# Patient Record
Sex: Female | Born: 1960 | Race: White | Hispanic: No | Smoking: Never smoker
Health system: Southern US, Community
[De-identification: ages and names within clinical notes are randomized; demographics above are authoritative.]

## PROBLEM LIST (undated history)

## (undated) DIAGNOSIS — Z974 Presence of external hearing-aid: Secondary | ICD-10-CM

## (undated) DIAGNOSIS — Z973 Presence of spectacles and contact lenses: Secondary | ICD-10-CM

## (undated) DIAGNOSIS — H9319 Tinnitus, unspecified ear: Secondary | ICD-10-CM

## (undated) DIAGNOSIS — D863 Sarcoidosis of skin: Secondary | ICD-10-CM

## (undated) DIAGNOSIS — I10 Essential (primary) hypertension: Secondary | ICD-10-CM

## (undated) DIAGNOSIS — F1020 Alcohol dependence, uncomplicated: Secondary | ICD-10-CM

## (undated) DIAGNOSIS — K603 Anal fistula, unspecified: Secondary | ICD-10-CM

## (undated) DIAGNOSIS — T4145XA Adverse effect of unspecified anesthetic, initial encounter: Secondary | ICD-10-CM

## (undated) DIAGNOSIS — D860 Sarcoidosis of lung: Secondary | ICD-10-CM

## (undated) DIAGNOSIS — T8859XA Other complications of anesthesia, initial encounter: Secondary | ICD-10-CM

## (undated) DIAGNOSIS — H903 Sensorineural hearing loss, bilateral: Secondary | ICD-10-CM

---

## 1997-09-08 ENCOUNTER — Inpatient Hospital Stay (HOSPITAL_COMMUNITY): Admission: EM | Admit: 1997-09-08 | Discharge: 1997-09-10 | Payer: Self-pay | Admitting: Emergency Medicine

## 1998-06-12 ENCOUNTER — Emergency Department (HOSPITAL_COMMUNITY): Admission: EM | Admit: 1998-06-12 | Discharge: 1998-06-12 | Payer: Self-pay | Admitting: Emergency Medicine

## 2005-11-28 ENCOUNTER — Encounter: Payer: Self-pay | Admitting: Internal Medicine

## 2006-12-18 ENCOUNTER — Encounter: Payer: Self-pay | Admitting: Internal Medicine

## 2006-12-23 ENCOUNTER — Ambulatory Visit: Payer: Self-pay | Admitting: Internal Medicine

## 2007-01-02 DIAGNOSIS — D869 Sarcoidosis, unspecified: Secondary | ICD-10-CM

## 2007-01-02 DIAGNOSIS — I10 Essential (primary) hypertension: Secondary | ICD-10-CM | POA: Insufficient documentation

## 2007-02-13 HISTORY — PX: ROBOTIC ASSISTED TOTAL HYSTERECTOMY: SHX6085

## 2007-03-12 ENCOUNTER — Ambulatory Visit: Payer: Self-pay | Admitting: Internal Medicine

## 2008-07-30 ENCOUNTER — Emergency Department (HOSPITAL_COMMUNITY): Admission: EM | Admit: 2008-07-30 | Discharge: 2008-07-31 | Payer: Self-pay | Admitting: Emergency Medicine

## 2008-07-31 ENCOUNTER — Ambulatory Visit: Payer: Self-pay | Admitting: *Deleted

## 2008-07-31 ENCOUNTER — Inpatient Hospital Stay (HOSPITAL_COMMUNITY): Admission: AD | Admit: 2008-07-31 | Discharge: 2008-08-03 | Payer: Self-pay | Admitting: Psychiatry

## 2008-08-10 ENCOUNTER — Ambulatory Visit (HOSPITAL_COMMUNITY): Payer: Self-pay | Admitting: Licensed Clinical Social Worker

## 2008-09-07 ENCOUNTER — Ambulatory Visit (HOSPITAL_COMMUNITY): Payer: Self-pay | Admitting: Marriage and Family Therapist

## 2008-09-09 ENCOUNTER — Ambulatory Visit (HOSPITAL_COMMUNITY): Payer: Self-pay | Admitting: Licensed Clinical Social Worker

## 2008-10-31 ENCOUNTER — Emergency Department (HOSPITAL_COMMUNITY): Admission: EM | Admit: 2008-10-31 | Discharge: 2008-11-01 | Payer: Self-pay | Admitting: Emergency Medicine

## 2008-11-01 ENCOUNTER — Inpatient Hospital Stay (HOSPITAL_COMMUNITY): Admission: AD | Admit: 2008-11-01 | Discharge: 2008-11-05 | Payer: Self-pay | Admitting: *Deleted

## 2008-11-01 ENCOUNTER — Ambulatory Visit: Payer: Self-pay | Admitting: *Deleted

## 2008-11-08 ENCOUNTER — Other Ambulatory Visit (HOSPITAL_COMMUNITY): Admission: RE | Admit: 2008-11-08 | Discharge: 2009-01-13 | Payer: Self-pay | Admitting: Psychiatry

## 2008-11-11 ENCOUNTER — Ambulatory Visit: Payer: Self-pay | Admitting: Psychiatry

## 2010-05-17 LAB — URINE DRUGS OF ABUSE SCREEN W ALC, ROUTINE (REF LAB)
Amphetamine Screen, Ur: NEGATIVE
Benzodiazepines.: NEGATIVE
Benzodiazepines.: NEGATIVE
Cocaine Metabolites: NEGATIVE
Cocaine Metabolites: NEGATIVE
Methadone: NEGATIVE
Methadone: NEGATIVE
Phencyclidine (PCP): NEGATIVE
Phencyclidine (PCP): NEGATIVE
Propoxyphene: NEGATIVE
Propoxyphene: NEGATIVE

## 2010-05-18 LAB — URINE DRUGS OF ABUSE SCREEN W ALC, ROUTINE (REF LAB)
Amphetamine Screen, Ur: NEGATIVE
Amphetamine Screen, Ur: NEGATIVE
Barbiturate Quant, Ur: NEGATIVE
Benzodiazepines.: NEGATIVE
Benzodiazepines.: POSITIVE — AB
Cocaine Metabolites: NEGATIVE
Cocaine Metabolites: NEGATIVE
Creatinine,U: 59.9 mg/dL
Creatinine,U: 74.7 mg/dL
Marijuana Metabolite: NEGATIVE
Methadone: NEGATIVE
Methadone: NEGATIVE
Methadone: NEGATIVE
Opiate Screen, Urine: NEGATIVE
Phencyclidine (PCP): NEGATIVE
Propoxyphene: NEGATIVE

## 2010-05-18 LAB — BENZODIAZEPINE, QUANTITATIVE, URINE
Alprazolam (GC/LC/MS), ur confirm: NEGATIVE
Flurazepam GC/MS Conf: NEGATIVE
Flurazepam GC/MS Conf: NEGATIVE
Nordiazepam GC/MS Conf: NEGATIVE

## 2010-05-19 LAB — CBC
HCT: 41.1 % (ref 36.0–46.0)
MCHC: 35.2 g/dL (ref 30.0–36.0)
MCV: 97 fL (ref 78.0–100.0)
Platelets: 283 10*3/uL (ref 150–400)
WBC: 6.2 10*3/uL (ref 4.0–10.5)

## 2010-05-19 LAB — URINE DRUGS OF ABUSE SCREEN W ALC, ROUTINE (REF LAB)
Cocaine Metabolites: NEGATIVE
Ethyl Alcohol: <10 mg/dL (ref <10)
Methadone: NEGATIVE
Opiate Screen, Urine: NEGATIVE
Phencyclidine (PCP): NEGATIVE
Propoxyphene: NEGATIVE

## 2010-05-19 LAB — URINALYSIS, ROUTINE W REFLEX MICROSCOPIC
Glucose, UA: NEGATIVE mg/dL
Hgb urine dipstick: NEGATIVE
Specific Gravity, Urine: 1.006 (ref 1.005–1.030)
pH: 5.5 (ref 5.0–8.0)

## 2010-05-19 LAB — RAPID URINE DRUG SCREEN, HOSP PERFORMED
Amphetamines: NOT DETECTED
Barbiturates: NOT DETECTED
Benzodiazepines: NOT DETECTED
Opiates: NOT DETECTED

## 2010-05-19 LAB — LIPASE, BLOOD: Lipase: 41 U/L (ref 11–59)

## 2010-05-19 LAB — COMPREHENSIVE METABOLIC PANEL
AST: 59 U/L — ABNORMAL HIGH (ref 0–37)
Albumin: 3.6 g/dL (ref 3.5–5.2)
BUN: 8 mg/dL (ref 6–23)
CO2: 24 mEq/L (ref 19–32)
Calcium: 8.6 mg/dL (ref 8.4–10.5)
Chloride: 103 mEq/L (ref 96–112)
Creatinine, Ser: 0.66 mg/dL (ref 0.4–1.2)
GFR calc Af Amer: >60 mL/min (ref >60)
GFR calc non Af Amer: >60 mL/min (ref >60)
Total Bilirubin: 0.6 mg/dL (ref 0.3–1.2)

## 2010-05-19 LAB — DIFFERENTIAL
Basophils Absolute: 0 10*3/uL (ref 0.0–0.1)
Lymphocytes Relative: 39 % (ref 12–46)
Lymphs Abs: 2.4 10*3/uL (ref 0.7–4.0)
Neutro Abs: 3.1 10*3/uL (ref 1.7–7.7)

## 2010-05-19 LAB — BENZODIAZEPINE, QUANTITATIVE, URINE
Flurazepam GC/MS Conf: NEGATIVE
Nordiazepam GC/MS Conf: 140 ng/mL
Oxazepam GC/MS Conf: 1900 ng/mL

## 2010-05-22 LAB — POCT I-STAT, CHEM 8
BUN: 3 mg/dL — ABNORMAL LOW (ref 6–23)
Chloride: 99 mEq/L (ref 96–112)
Glucose, Bld: 106 mg/dL — ABNORMAL HIGH (ref 70–99)
HCT: 46 % (ref 36.0–46.0)
Potassium: 3.4 mEq/L — ABNORMAL LOW (ref 3.5–5.1)

## 2010-05-22 LAB — RAPID URINE DRUG SCREEN, HOSP PERFORMED
Amphetamines: NOT DETECTED
Barbiturates: NOT DETECTED
Tetrahydrocannabinol: NOT DETECTED

## 2010-05-22 LAB — ETHANOL: Alcohol, Ethyl (B): 182 mg/dL — ABNORMAL HIGH (ref 0–10)

## 2010-05-22 LAB — CBC
MCHC: 35.3 g/dL (ref 30.0–36.0)
MCV: 97 fL (ref 78.0–100.0)
Platelets: 213 10*3/uL (ref 150–400)
RDW: 16.3 % — ABNORMAL HIGH (ref 11.5–15.5)

## 2010-05-22 LAB — URINALYSIS, ROUTINE W REFLEX MICROSCOPIC
Hgb urine dipstick: NEGATIVE
Nitrite: NEGATIVE
Protein, ur: NEGATIVE mg/dL
Urobilinogen, UA: 0.2 mg/dL (ref 0.0–1.0)

## 2010-05-22 LAB — DIFFERENTIAL
Basophils Absolute: 0 10*3/uL (ref 0.0–0.1)
Basophils Relative: 1 % (ref 0–1)
Eosinophils Absolute: 0.1 10*3/uL (ref 0.0–0.7)
Monocytes Relative: 6 % (ref 3–12)
Neutrophils Relative %: 53 % (ref 43–77)

## 2010-06-27 NOTE — Discharge Summary (Signed)
Miranda Dominguez, Miranda Dominguez NO.:  [id number]   MEDICAL RECORD NO.:  [id number]          PATIENT TYPE:  IPS   LOCATION:  0304                          FACILITY:  BH   PHYSICIAN:  Jasmine Pang, M.D. DATE OF BIRTH:  07-25-1960   DATE OF ADMISSION:  07/31/2008  DATE OF DISCHARGE:  08/03/2008                               DISCHARGE SUMMARY   IDENTIFICATION:  This is a 50 year old divorced white female from  [place], who was admitted on a voluntary basis on July 31, 2008.   HISTORY OF PRESENT ILLNESS:  The patient came to [facility]  Emergency Department and presented requesting detox from alcohol.  She  reported that prior to May 2009 she had been sober for about 3 years.  She made a return visit to her home in [place] and relapsed drinking  beer.  She had also lost her employment February 2009.  She has recently  been reemployed.  She is 60 days into 90-day probationary period, but  she drinks over a pack of beer a day.  She reports that she gets shakes,  but no DTs.  She reports her last drink was just before coming to the  emergency room.  Her initial alcohol level was 334.  It came down to 182  prior to being transferred over here for detox.  She denies withdrawal  seizures, blackouts for full blown DTs.  She has a roommate, who is a  big support to her.  They used to be partners, but now they are just  roommates.  The patient reports having had detox here at [facility]  about 10 years ago once or twice.  She has detoxed herself since then.  She has had no other actual rehab.  For further admission information,  see psychiatric admission assessment.  An initial axis I diagnosis of  alcohol dependence was given.  On axis III diagnosis of hypertension was  given.   PHYSICAL FINDINGS:  There were no acute physical or medical problems  noted.  She was fully evaluated at the [facility] ED.   DIAGNOSTIC STUDIES:  She had an alcohol level of 334, which  came down to  182 in the ED.  Her UDS was negative.  Her urinalysis was negative.  Her  sodium was slightly low at 133.  Her other laboratory results did not  require any followup.   HOSPITAL COURSE:  Upon admission, the patient was placed on the Librium  detox protocol.  She was also restarted on her home medication of  atenolol 50 mg p.o. daily, hydrochlorothiazide 25 mg daily, and Celexa  20 mg q.h.s.  In individual sessions, the patient was initially casually  dressed with fair eye contact.  Motor behavior was within normal limits.  Speech was normal rate and flow.  Mood was anxious, depressed.  Affect  was consistent with mood.  There was no evidence of thought disorder or  psychosis.  The patient's Celexa was increased to 20 mg p.o. q.a.m. and  q.h.s.  As hospitalization progressed, patient's sleep was good appetite  was good.  She  became less depressed, less anxious.  She discussed her  support system, especially her roommate, who has been a good friend to  her.  Roommate plans to pick her up at discharge tomorrow.  She was  having no side effects on her medications.  On August 03, 2008, mental  status had improved markedly from admission status.  The patient's mood  continued to be less depressed, less anxious.  Affect consistent with  mood.  There was no suicidal or homicidal ideation.  No thoughts of self-  injurious behavior.  No auditory or visual hallucinations.  No thought  disorder.  No paranoia or delusions.  The patient detoxed well without  complications.  She wanted to go home today and was felt to be safe for  discharge. She did have a family session with her roommate this morning,  who was very supportive.  Unfortunately, she did find out that her dog  had died while she was here, and she was somewhat tearful when  discussing this particular issue.  She felt well enough; however, to  leave the hospital and wanted to go home to make plans about the dog.   DISCHARGE  DIAGNOSES:  Axis I:  Depressive disorder, not otherwise  specified, alcohol dependence.  Axis II:  None.  Axis III:  Hypertension.  Axis IV:  Moderate (other psychosocial issues, burden of psychiatric  illness, and chemical dependency, new job).  Axis V:  Global assessment of functioning was 60 upon discharge.  GAF  was 50 upon admission.  GAF highest past year was 70-75.   DISCHARGE PLAN:  There was no specific activity level or dietary  restrictions.   POSTHOSPITAL CARE PLANS:  The patient will see Cleophas Dunker for  counseling on August 10, 2008, at 9:30 a.m.  She will also follow up with  Dr. Andee Poles for medication management.   DISCHARGE MEDICATIONS:  1. Tenormin 50 mg daily.  2. Hydrochlorothiazide 25 mg daily.  3. Celexa 20 mg twice daily.  4. Librium 25 mg 1 pill daily for 3 days then discontinue and detox      will be complete.      Jasmine Pang, M.D.  Electronically Signed     BHS/MEDQ  D:  08/03/2008  T:  08/04/2008  Job:  [id number]

## 2010-06-27 NOTE — H&P (Signed)
Miranda Dominguez, HERMAN NO.:  [id number]   MEDICAL RECORD NO.:  [id number]          PATIENT TYPE:  IPS   LOCATION:  0304                          FACILITY:  BH   PHYSICIAN:  Jasmine Pang, M.D. DATE OF BIRTH:  1960-06-27   DATE OF ADMISSION:  07/31/2008  DATE OF DISCHARGE:                       PSYCHIATRIC ADMISSION ASSESSMENT   HISTORY OF PRESENT ILLNESS:  This is a voluntary admission to the  services of Dr. Milford Cage.  This is a 50 year old divorced white  female.  She came to the [facility] emergency department,  presented requesting detox from alcohol.  She reported that prior to May  of 2009 she had been sober about 3 years.  She made a return visit to  her home in [place] and relapsed drinking beer.  She had also lost her  employment in February of 2009.  She has recently been re-employed.  She  is 60 days into a 90 probationary period but she drinks over a pack of  beer a day.  She reports that she gets shakes but no DT's.  She reports  her last drink was just before coming to the emergency room.  Her  initial alcohol level was 334.  It came down to 182 prior to being  transferred over here for detox.  She denies withdrawal seizures, black  outs or full blown DT's.  She has a roommate, a Tawni Carnes who is very  active in Al-Anon.  They used to be partners but now they are just  roommates and Ms. Reed was active in Al-Anon prior to these two meeting.   PAST PSYCHIATRIC HISTORY:  She reports having had detox here at [facility] about 10 years ago once or twice and she has detoxed herself since  then.  No other actual rehab's.   SOCIAL HISTORY:  She has had some college.  She has been married and  divorced once.  She has no children.  She is 60 days into her current  employment with [employer].   FAMILY HISTORY:  Her mother and father drank after dinner, after work as  was the custom in [place] but neither one of them became  alcoholics.  She has a maternal uncle who was sober for 50 years.   PRIMARY CARE PHYSICIAN:  [facility].  She is followed for hypertension.  Today she has mild tremor.   MEDICATIONS:  She is currently prescribed:  1. Atenolol 50 mg p.o. daily.  2. Hydrochlorothiazide 25 mg p.o. daily.  3. Celexa 25 mg at h.s.   DRUG ALLERGIES:  No known drug allergies.   POSITIVE PHYSICAL FINDINGS:  As already stated, she had an enthusiastic  alcohol level of 334 which came down to 182.  Her UDS was negative.  Her  urinalysis was negative.  Her sodium was slightly low at 133.  Her other  laboratory results did not require any followup.   MENTAL STATUS EXAM:  Today, she is alert and oriented.  She is  appropriately, albeit casually, groomed in shorts and tee shirt.  She  appears to be adequately nourished.  Her  speech is a little soft.  She  reports that when she came back from [place] about a year ago she had  issues with hearing with an ear infection and she has not quite fully  recovered her hearing since.  Her mood is appropriately anxiously  depressed.  She is quite concerned about her employment.  Her affect is  congruent.  Judgment and insight are good.  Concentration and memory are  intact.  Intelligence is average.  She denies any suicidal or homicidal  ideations. She denies any auditory or visual hallucinations.   DIAGNOSES:  Axis I:  Alcohol dependence requiring detoxification.  Axis II:  Deferred.  Axis III:  Hypertension.  Axis IV:  Social.  Axis V:  50.   PLAN:  The plan is to help her through with withdrawal by utilizing the  low dose Librium protocol.  She requests assistance from the case  manager in contacting her job in the morning to make appropriate  discharge plans.   ESTIMATED LENGTH OF STAY:  Is 3 days.      Mickie Leonarda Salon, P.A.-C.      Jasmine Pang, M.D.  Electronically Signed    MD/MEDQ  D:  08/01/2008  T:  08/01/2008  Job:  [id number]

## 2010-06-27 NOTE — Assessment & Plan Note (Signed)
HEALTHCARE                             PULMONARY OFFICE NOTE   Miranda, Dominguez                     MRN:          [id number]  DATE:12/23/2006                            DOB:          11-01-1960    REASON FOR CONSULTATION:  Sarcoid.   HISTORY:  A 50 year old white female never-smoker with no significant  respiratory complaints.  She developed a lesion over her left proximal  arm that was biopsied a year ago by Dr. 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  This led to a CT  scan suggesting sarcoid and this led to the patient seeing me.  She  specifically denied any acute or chronic respiratory symptoms consisting  of cough, dyspnea, unexplained chest pains, fevers, chills, sweats,  myalgias, arthralgias or ocular complaints.   PAST MEDICAL HISTORY:  Significant for hypertension only.   ALLERGIES:  None known.   MEDICATIONS:  Atenolol and hydrochlorothiazide.   SOCIAL HISTORY:  She has never smoked and denies any usual travel, pet  or hobby, or work exposure history.   FAMILY HISTORY:  Significant for the absence of sarcoid.  Positive for  cancer in her mother of unknown cell type, prostate cancer in her  father.   REVIEW OF SYSTEMS:  Also taken in detail on the worksheet, negative  except as outlined above.   PHYSICAL EXAMINATION:  This is a pleasant ambulatory white female in no  acute distress.  She is afebrile with normal vital signs.  HEENT:  Unremarkable, oropharynx is clear.  NECK:  Supple without cervical adenopathy or tenderness.  Trachea is  midline, no thyromegaly.  LUNG FIELDS:  Perfectly clear bilaterally to auscultation and percussion  with no adventitial breath sounds.  Regular rate and rhythm without murmur, gallop, rub.  ABDOMEN:  Soft, benign.  EXTREMITIES:  Warm without calf tenderness, cyanosis, clubbing, or  edema.  There is a slightly erythematous plaque-like lesion about the  size of a dime over her left arm proximally in the lateral but noting on  the extensor surfaces of the elbows or knees or face.   Chest CT scan was available from [facility] showing parenchymal  scarring in the right upper lobe and diffuse increase in interstitial  markings with a fibronodular pattern associated with lymph nodes  containing calcium.   LABORATORY DATA:  Included a normal CBC with no eosinophils and a sed  rate of 24 on October 13.  ACE level was not reported.   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  She does not recall any symptoms that suggested  pulmonary symptoms related to sarcoid and denies ever having had a  previous chest x-ray, at least not to her knowledge for the last 10  years.  The issue is therefore whether there is any need for a specific  diagnosis of pulmonary sarcoid here and whether or not she might be a  candidate for prednisone.  Certainly, from a pulmonary perspective I  think it would be difficult to  justify the side effects of prednisone  for limited benefit.   She has already been prescribed a steroid cream by Dr. Swaziland for the  very focal involvement of her skin and I think this is certainly  appropriate and would consider adding Plaquenil if her skin disease  became much more of an issues, which presently is not the case.   For now, I plan to follow her up in 6 weeks with a baseline PFT and  chest x-ray (she said would think about whether she might have some old  x-rays somewhere that she could dig up although I think it is unlikely  that we will find any, but I have asked her to bring these if this is  the case.   ADDENDUM:  I was able to find a chest x-ray report dated November 25, 2006, that shows classic sarcoid changes by report but this was not  available for review today.  No previous x-rays were commented on  at the  time of this report.     Charlaine Dalton. Sherene Sires, MD, [facility]  Electronically Signed    MBW/MedQ  DD: 12/23/2006  DT: 12/24/2006  Job #: [id number]   cc:   Carma Leaven, DO  Amy Y. Swaziland, M.D.

## 2011-11-21 DIAGNOSIS — K611 Rectal abscess: Secondary | ICD-10-CM | POA: Diagnosis present

## 2012-08-07 HISTORY — PX: PLACEMENT OF SETON: SHX6029

## 2015-02-13 HISTORY — PX: INCISION AND DRAINAGE ABSCESS ANAL: SUR669

## 2015-02-23 ENCOUNTER — Telehealth: Payer: Self-pay | Admitting: General Surgery

## 2015-02-23 NOTE — Telephone Encounter (Signed)
Patient called stating she was having a lot of bleeding in her gallstones was about the common out. I attempted to call the patient back and no one answered.

## 2015-06-02 ENCOUNTER — Emergency Department (HOSPITAL_COMMUNITY)
Admission: EM | Admit: 2015-06-02 | Discharge: 2015-06-02 | Disposition: A | Discharge status: Home / Self Care | Payer: Managed Care, Other (non HMO) | Attending: Emergency Medicine | Admitting: Emergency Medicine

## 2015-06-02 ENCOUNTER — Encounter (HOSPITAL_COMMUNITY): Payer: Self-pay | Admitting: *Deleted

## 2015-06-02 DIAGNOSIS — F1029 Alcohol dependence with unspecified alcohol-induced disorder: Secondary | ICD-10-CM | POA: Diagnosis not present

## 2015-06-02 DIAGNOSIS — I10 Essential (primary) hypertension: Secondary | ICD-10-CM | POA: Diagnosis not present

## 2015-06-02 DIAGNOSIS — F1010 Alcohol abuse, uncomplicated: Secondary | ICD-10-CM | POA: Diagnosis present

## 2015-06-02 HISTORY — DX: Essential (primary) hypertension: I10

## 2015-06-02 NOTE — ED Notes (Addendum)
Pt. Family member approached RN at desk asking when MD would be in the room. RN explained MD very busy with critical patients and will be in the room whenever they are available. Pt. Family member sts that she is very upset that they have been waiting so long. RN explained pt. Will be evaluated by TTS soon.

## 2015-06-02 NOTE — ED Notes (Signed)
Received call from Cyndie MullAnna Andrews, [facility], regarding [facility]. Questioned as to whether patient is SI/HI or has other mental health issues. RN stepped in to ask patient. Patient denies SI/HI/mental health issues, and reports that she is strictly here for Detox. Reports that she was instructed by EAP to come to the ED for medical clearance in order to proceed with detox.  Tobi Bastosnna states that she will fax information to Pod A for patient. If EDP requests further assessment, [facility] will need to be notified. At [facility] point, [facility] not feel [facility] is needed since patient does not have mental health issues.

## 2015-06-02 NOTE — ED Provider Notes (Signed)
CSN: [id number]     Arrival date & time 06/02/15  1429 History   First MD Initiated Contact with Patient 06/02/15 1819     Chief Complaint  Patient presents with  . Alcohol Problem  . Medical Clearance     (Consider location/radiation/quality/duration/timing/severity/associated sxs/prior Treatment) HPI Comments: Patient is a 55 year old female with history of chronic alcoholism. She presents today requesting detox from alcohol. She reports drinking at least 12 years daily and has done this for many years. She reports that she has a "maintenance drinker" and requires alcohol to prevent her from becoming shaky and going into DTs. She has 1 prior admission for detox. This was 7 or 8 years ago. She is here with her significant other who reports her drinking is becoming a problem.  Patient is a 55 y.o. female presenting with alcohol problem. The history is provided by the patient.  Alcohol Problem This is a chronic problem. The problem occurs constantly. Nothing aggravates the symptoms. Nothing relieves the symptoms. She has tried nothing for the symptoms.    Past Medical History  Diagnosis Date  . Hypertension    History reviewed. No pertinent past surgical history. History reviewed. No pertinent family history. Social History  Substance Use Topics  . Smoking status: Never Smoker   . Smokeless tobacco: None  . Alcohol Use: Yes     Comment: heavily   OB History    No data available     Review of Systems  All other systems reviewed and are negative.     Allergies  Review of patient's allergies indicates no known allergies.  Home Medications   Prior to Admission medications   Medication Sig Start Date End Date Taking? Authorizing Provider  atenolol (TENORMIN) 50 MG tablet Take by mouth.   Yes Historical Provider, MD  hydrochlorothiazide (HYDRODIURIL) 25 MG tablet Take by mouth.   Yes Historical Provider, MD  LORazepam (ATIVAN) 0.5 MG tablet Take 0.5 tablets by mouth as  needed. anxiety 05/12/15  Yes Historical Provider, MD   BP 131/91 mmHg  Pulse 92  Temp(Src) 98.2 F (36.8 C) (Oral)  Resp 14  Ht 5\' 5"  (1.651 m)  Wt 180 lb (81.647 kg)  BMI 29.95 kg/m2  SpO2 98% Physical Exam  Constitutional: She is oriented to person, place, and time. She appears well-developed and well-nourished. No distress.  HENT:  Head: Normocephalic and atraumatic.  Eyes: EOM are normal. Pupils are equal, round, and reactive to light.  Neck: Normal range of motion. Neck supple.  Cardiovascular: Normal rate and regular rhythm.  Exam reveals no gallop and no friction rub.   No murmur heard. Pulmonary/Chest: Effort normal and breath sounds normal. No respiratory distress. She has no wheezes.  Abdominal: Soft. Bowel sounds are normal. She exhibits no distension. There is no tenderness.  Musculoskeletal: Normal range of motion.  Neurological: She is alert and oriented to person, place, and time.  Skin: Skin is warm and dry. She is not diaphoretic.  Nursing note and vitals reviewed.   ED Course  Procedures (including critical care time) Labs Review Labs Reviewed - No data to display  Imaging Review No results found. I have personally reviewed and evaluated these images and lab results as part of my medical decision-making.    MDM   Final diagnoses:  None    Patient is a 55 year old female who presents with complaints of alcohol abuse and is requesting detox from this. She tells me that [facility] and was instructed to come to the emergency department for admission for this. The patient waited in the waiting room for several hours prior to being brought back to her room. I then probably evaluated her and placed consultation for TTS to speak with her. Apparently TTS was having a heavy work load and asked the nurse whether the patient was suicidal or homicidal or exhibiting any evidence for psychiatric dual diagnosis. When informed the  patient was not, information was faxed by TTS for the patient to arrange outpatient treatment.  I became busy with several critical patients. When I provided this information to the patient and her significant other, they were very unhappy with how long they were here and to not be transferred to behavioral health. I attempted to explain the situation to them, however they continue to express displeasure with how this was handled. She appears in my opinion to be medically cleared and I do not feel as though any workup is indicated. She is to call the numbers but she was provided to arrange outpatient treatment.    Geoffery Lyons, MD 06/02/15 2044

## 2015-06-02 NOTE — ED Notes (Signed)
Pt reports last drinking alcohol pta, is requesting detox. Denies SI or HI.

## 2015-06-02 NOTE — [facility] Counselor (Addendum)
This TTS Counselor spoke with Kendal HymenBonnie, [facility], as EDP was busy with critical cases in the ED at the moment. This Clinical research associatewriter asked about necessity for TTS assessment, as pt had originally stated that she was here strictly for detox from alcohol. This Clinical research associatewriter explained that [facility] did not treat straight detox cases any longer and inquired about any additional mental health issues, including SI/HI and A/VH.  Kendal HymenBonnie, RN, went to speak with pt and she denied any hx of mental illness or current mood d/o. She denies SI/HI and states she is here strictly for alcohol detox. She says her EAP told her to come to the ED tonight. This Clinical research associatewriter overheard this entire conversation between [employer] and pt while remaining on the line.   This Clinical research associatewriter agreed to fax SA/detox resources to ED for the pt. RN stated that she would relay this info to EDP and call back if he insisted that we see pt for an assessment anyway. Until then, writer asked that consult be removed and re-entered if needed.  This counselor faxed resource info to both Pod [facility] around 8:10pm.   Resources were re-sent at 8:30pm. Pt is cleared from a psych standpoint.   - Cyndie MullAnna Sufyan Meidinger, [facility]   Therapeutic Triage   [facility]     Ext [phone]

## 2015-06-02 NOTE — Discharge Instructions (Signed)
Call the provided phone numbers to arrange treatment.   Alcohol Use Disorder Alcohol use disorder is a mental disorder. It is not a one-time incident of heavy drinking. Alcohol use disorder is the excessive and uncontrollable use of alcohol over time that leads to problems with functioning in one or more areas of daily living. People with this disorder risk harming themselves and others when they drink to excess. Alcohol use disorder also can cause other mental disorders, such as mood and anxiety disorders, and serious physical problems. People with alcohol use disorder often misuse other drugs.  Alcohol use disorder is common and widespread. Some people with this disorder drink alcohol to cope with or escape from negative life events. Others drink to relieve chronic pain or symptoms of mental illness. People with a family history of alcohol use disorder are at higher risk of losing control and using alcohol to excess.  Drinking too much alcohol can cause injury, accidents, and health problems. One drink can be too much when you are:  Working.  Pregnant or breastfeeding.  Taking medicines. Ask your doctor.  Driving or planning to drive. SYMPTOMS  Signs and symptoms of alcohol use disorder may include the following:   Consumption ofalcohol inlarger amounts or over a longer period of time than intended.  Multiple unsuccessful attempts to cutdown or control alcohol use.   A great deal of time spent obtaining alcohol, using alcohol, or recovering from the effects of alcohol (hangover).  A strong desire or urge to use alcohol (cravings).   Continued use of alcohol despite problems at work, school, or home because of alcohol use.   Continued use of alcohol despite problems in relationships because of alcohol use.  Continued use of alcohol in situations when it is physically hazardous, such as driving a car.  Continued use of alcohol despite awareness of a physical or psychological  problem that is likely related to alcohol use. Physical problems related to alcohol use can involve the brain, heart, liver, stomach, and intestines. Psychological problems related to alcohol use include intoxication, depression, anxiety, psychosis, delirium, and dementia.   The need for increased amounts of alcohol to achieve the same desired effect, or a decreased effect from the consumption of the same amount of alcohol (tolerance).  Withdrawal symptoms upon reducing or stopping alcohol use, or alcohol use to reduce or avoid withdrawal symptoms. Withdrawal symptoms include:  Racing heart.  Hand tremor.  Difficulty sleeping.  Nausea.  Vomiting.  Hallucinations.  Restlessness.  Seizures. DIAGNOSIS Alcohol use disorder is diagnosed through an assessment by your health care provider. Your health care provider may start by asking three or four questions to screen for excessive or problematic alcohol use. To confirm a diagnosis of alcohol use disorder, at least two symptoms must be present within a 6066-month period. The severity of alcohol use disorder depends on the number of symptoms:  Mild--two or three.  Moderate--four or five.  Severe--six or more. Your health care provider may perform a physical exam or use results from lab tests to see if you have physical problems resulting from alcohol use. Your health care provider may refer you to a mental health professional for evaluation. TREATMENT  Some people with alcohol use disorder are able to reduce their alcohol use to low-risk levels. Some people with alcohol use disorder need to quit drinking alcohol. When necessary, mental health professionals with specialized training in substance use treatment can help. Your health care provider can help you decide how severe your alcohol  use disorder is and what type of treatment you need. The following forms of treatment are available:   Detoxification. Detoxification involves the use of  prescription medicines to prevent alcohol withdrawal symptoms in the first week after quitting. This is important for people with a history of symptoms of withdrawal and for heavy drinkers who are likely to have withdrawal symptoms. Alcohol withdrawal can be dangerous and, in severe cases, cause death. Detoxification is usually provided in a hospital or in-patient substance use treatment facility.  Counseling or talk therapy. Talk therapy is provided by substance use treatment counselors. It addresses the reasons people use alcohol and ways to keep them from drinking again. The goals of talk therapy are to help people with alcohol use disorder find healthy activities and ways to cope with life stress, to identify and avoid triggers for alcohol use, and to handle cravings, which can cause relapse.  Medicines.Different medicines can help treat alcohol use disorder through the following actions:  Decrease alcohol cravings.  Decrease the positive reward response felt from alcohol use.  Produce an uncomfortable physical reaction when alcohol is used (aversion therapy).  Support groups. Support groups are run by people who have quit drinking. They provide emotional support, advice, and guidance. These forms of treatment are often combined. Some people with alcohol use disorder benefit from intensive combination treatment provided by specialized substance use treatment centers. Both inpatient and outpatient treatment programs are available.   This information is not intended to replace advice given to you by your health care provider. Make sure you discuss any questions you have with your health care provider.   Document Released: 03/08/2004 Document Revised: 02/19/2014 Document Reviewed: 05/08/2012 Elsevier Interactive Patient Education [employer]

## 2015-07-08 DIAGNOSIS — H9319 Tinnitus, unspecified ear: Secondary | ICD-10-CM

## 2015-07-08 DIAGNOSIS — H903 Sensorineural hearing loss, bilateral: Secondary | ICD-10-CM

## 2015-07-08 HISTORY — DX: Tinnitus, unspecified ear: H93.19

## 2015-07-08 HISTORY — DX: Sensorineural hearing loss, bilateral: H90.3

## 2015-07-10 ENCOUNTER — Encounter (HOSPITAL_COMMUNITY): Payer: Self-pay | Admitting: Emergency Medicine

## 2015-07-10 ENCOUNTER — Emergency Department (HOSPITAL_COMMUNITY)
Admission: EM | Admit: 2015-07-10 | Discharge: 2015-07-10 | Disposition: A | Discharge status: Home / Self Care | Payer: Managed Care, Other (non HMO) | Attending: Emergency Medicine | Admitting: Emergency Medicine

## 2015-07-10 ENCOUNTER — Telehealth: Payer: Self-pay | Admitting: Surgery

## 2015-07-10 DIAGNOSIS — E669 Obesity, unspecified: Secondary | ICD-10-CM | POA: Insufficient documentation

## 2015-07-10 DIAGNOSIS — E876 Hypokalemia: Secondary | ICD-10-CM

## 2015-07-10 DIAGNOSIS — K603 Anal fistula: Secondary | ICD-10-CM

## 2015-07-10 DIAGNOSIS — K610 Anal abscess: Secondary | ICD-10-CM | POA: Diagnosis not present

## 2015-07-10 DIAGNOSIS — I10 Essential (primary) hypertension: Secondary | ICD-10-CM | POA: Diagnosis not present

## 2015-07-10 DIAGNOSIS — L309 Dermatitis, unspecified: Secondary | ICD-10-CM | POA: Insufficient documentation

## 2015-07-10 DIAGNOSIS — K611 Rectal abscess: Secondary | ICD-10-CM | POA: Diagnosis present

## 2015-07-10 HISTORY — DX: Sensorineural hearing loss, bilateral: H90.3

## 2015-07-10 HISTORY — DX: Tinnitus, unspecified ear: H93.19

## 2015-07-10 LAB — CBC WITH DIFFERENTIAL/PLATELET
BASOS ABS: 0 10*3/uL (ref 0.0–0.1)
Basophils Relative: 0 %
EOS PCT: 1 %
Eosinophils Absolute: 0.1 10*3/uL (ref 0.0–0.7)
HEMATOCRIT: 38.8 % (ref 36.0–46.0)
Hemoglobin: 14 g/dL (ref 12.0–15.0)
LYMPHS PCT: 13 %
Lymphs Abs: 1.6 10*3/uL (ref 0.7–4.0)
MCH: 33.1 pg (ref 26.0–34.0)
MCHC: 36.1 g/dL — ABNORMAL HIGH (ref 30.0–36.0)
MCV: 91.7 fL (ref 78.0–100.0)
Monocytes Absolute: 1.2 10*3/uL — ABNORMAL HIGH (ref 0.1–1.0)
Monocytes Relative: 10 %
NEUTROS ABS: 9.4 10*3/uL — AB (ref 1.7–7.7)
Neutrophils Relative %: 76 %
PLATELETS: 302 10*3/uL (ref 150–400)
RBC: 4.23 MIL/uL (ref 3.87–5.11)
RDW: 12.5 % (ref 11.5–15.5)
WBC: 12.4 10*3/uL — AB (ref 4.0–10.5)

## 2015-07-10 LAB — BASIC METABOLIC PANEL
ANION GAP: 11 (ref 5–15)
BUN: 8 mg/dL (ref 6–20)
CO2: 25 mmol/L (ref 22–32)
Calcium: 9.6 mg/dL (ref 8.9–10.3)
Chloride: 98 mmol/L — ABNORMAL LOW (ref 101–111)
Creatinine, Ser: 0.88 mg/dL (ref 0.44–1.00)
GFR calc Af Amer: >60 mL/min (ref >60)
Glucose, Bld: 95 mg/dL (ref 65–99)
POTASSIUM: 2.7 mmol/L — AB (ref 3.5–5.1)
SODIUM: 134 mmol/L — AB (ref 135–145)

## 2015-07-10 MED ORDER — MENTHOL 3 MG MT LOZG: 1.0000 | LOZENGE | OROMUCOSAL | Status: DC | PRN

## 2015-07-10 MED ORDER — ALUM & MAG HYDROXIDE-SIMETH 200-200-20 MG/5ML PO SUSP: 30.0000 mL | Freq: Four times a day (QID) | ORAL | Status: DC | PRN

## 2015-07-10 MED ORDER — LIDOCAINE HCL 1 % IJ SOLN
INTRAMUSCULAR | Status: AC
  Filled 2015-07-10: qty 20

## 2015-07-10 MED ORDER — NAPROXEN 500 MG PO TABS: 500.0000 mg | ORAL_TABLET | Freq: Three times a day (TID) | ORAL | Status: DC

## 2015-07-10 MED ORDER — POTASSIUM CHLORIDE CRYS ER 20 MEQ PO TBCR
60.0000 meq | EXTENDED_RELEASE_TABLET | Freq: Once | ORAL | Status: AC
  Administered 2015-07-10: 60 meq via ORAL
  Filled 2015-07-10: qty 3

## 2015-07-10 MED ORDER — MAGIC MOUTHWASH
15.0000 mL | Freq: Four times a day (QID) | ORAL | Status: DC | PRN
  Filled 2015-07-10: qty 15

## 2015-07-10 MED ORDER — LACTATED RINGERS IV BOLUS (SEPSIS): 1000.0000 mL | Freq: Three times a day (TID) | INTRAVENOUS | Status: DC | PRN

## 2015-07-10 MED ORDER — LORAZEPAM 0.5 MG PO TABS: 0.2500 mg | ORAL_TABLET | ORAL | Status: DC | PRN

## 2015-07-10 MED ORDER — OXYCODONE HCL 5 MG PO TABS: 5.0000 mg | ORAL_TABLET | ORAL | Status: DC | PRN

## 2015-07-10 MED ORDER — CEFTRIAXONE SODIUM 2 G IJ SOLR
2.0000 g | Freq: Once | INTRAMUSCULAR | Status: AC
  Administered 2015-07-10: 2 g via INTRAVENOUS
  Filled 2015-07-10: qty 2

## 2015-07-10 MED ORDER — OXYCODONE-ACETAMINOPHEN 5-325 MG PO TABS: 1.0000 | ORAL_TABLET | Freq: Four times a day (QID) | ORAL | Status: DC | PRN

## 2015-07-10 MED ORDER — LIDOCAINE HCL (PF) 1 % IJ SOLN
30.0000 mL | Freq: Once | INTRAMUSCULAR | Status: AC
  Administered 2015-07-10: 30 mL

## 2015-07-10 MED ORDER — CEPHALEXIN 500 MG PO CAPS: 500.0000 mg | ORAL_CAPSULE | Freq: Four times a day (QID) | ORAL | Status: DC

## 2015-07-10 MED ORDER — POTASSIUM CHLORIDE ER 10 MEQ PO TBCR: 10.0000 meq | EXTENDED_RELEASE_TABLET | Freq: Every day | ORAL | Status: DC

## 2015-07-10 MED ORDER — ACETAMINOPHEN 325 MG PO TABS: 325.0000 mg | ORAL_TABLET | Freq: Four times a day (QID) | ORAL | Status: DC | PRN

## 2015-07-10 MED ORDER — PHENOL 1.4 % MT LIQD: 2.0000 | OROMUCOSAL | Status: DC | PRN

## 2015-07-10 MED ORDER — HYDROMORPHONE HCL 1 MG/ML IJ SOLN: 0.5000 mg | INTRAMUSCULAR | Status: DC | PRN

## 2015-07-10 MED ORDER — NAPROXEN 500 MG PO TABS: 500.0000 mg | ORAL_TABLET | Freq: Two times a day (BID) | ORAL | Status: DC

## 2015-07-10 MED ORDER — AMOXICILLIN-POT CLAVULANATE 875-125 MG PO TABS: 1.0000 | ORAL_TABLET | Freq: Two times a day (BID) | ORAL | Status: DC

## 2015-07-10 MED ORDER — METRONIDAZOLE IN NACL 5-0.79 MG/ML-% IV SOLN
500.0000 mg | Freq: Once | INTRAVENOUS | Status: AC
  Administered 2015-07-10: 500 mg via INTRAVENOUS
  Filled 2015-07-10: qty 100

## 2015-07-10 MED ORDER — LIP MEDEX EX OINT
1.0000 "application " | TOPICAL_OINTMENT | Freq: Two times a day (BID) | CUTANEOUS | Status: DC
  Filled 2015-07-10: qty 7

## 2015-07-10 NOTE — ED Notes (Signed)
Surgeon at bedside.  

## 2015-07-10 NOTE — ED Provider Notes (Signed)
CSN: [id number]     Arrival date & time 07/10/15  1018 History   First MD Initiated Contact with Patient 07/10/15 1037     Chief Complaint  Patient presents with  . Abscess     Patient is a 55 y.o. female presenting with abscess. The history is provided by the patient. No language interpreter was used.  Abscess  Miranda Dominguez is a 55 y.o. female who presents to the Emergency Department complaining of abscess.  She has a hx/o perianal abscess that has been followed in the past by a surgeon in [place].  Five days ago she began to feel increased pain and swelling and that area and she presented to the [facility] and was evaluated three days ago.  A needle aspiration was performed at that time that she states was dry and she was started on antibiotics (bactrim she believes).  Since that time she has experienced significantly increased pain and swelling in that area.  She has associated malaise and difficulty urinating and a fear of having a bowel movement.  She denies any fever, vomiting.  Sxs are moderate, constant, worsening.  She states that she was being evaluated for possible fistula and there was discussion of having an MRI performed.    Past Medical History  Diagnosis Date  . Anal abscess 2014, 2017    I&D [place] 2014, I&D in [place] Jan 2017  . SARCOIDOSIS 01/02/2007    Qualifier: Diagnosis of  By: Thad Ranger LPN, Megan    . History of ETOH abuse   . Dermatitis 07/10/2015    Dr [place] w dermatology.  ?Sarcoid related   . Bilateral sensorineural hearing loss 07/08/2015  . Subjective tinnitus 07/08/2015  . HYPERTENSION 01/02/2007    Qualifier: Diagnosis of  By: Thad Ranger LPN, Megan     History reviewed. No pertinent past surgical history. No family history on file. Social History  Substance Use Topics  . Smoking status: Never Smoker   . Smokeless tobacco: None  . Alcohol Use: Yes     Comment: heavily   OB History    No data available     Review of Systems  All  other systems reviewed and are negative.     Allergies  Review of patient's allergies indicates no known allergies.  Home Medications   Prior to Admission medications   Medication Sig Start Date End Date Taking? Authorizing Provider  amoxicillin-clavulanate (AUGMENTIN) 875-125 MG tablet Take 1 tablet by mouth 2 (two) times daily. 07/10/15   Karie Soda, MD  atenolol (TENORMIN) 50 MG tablet Take by mouth.    Historical Provider, MD  hydrochlorothiazide (HYDRODIURIL) 25 MG tablet Take by mouth.    Historical Provider, MD  LORazepam (ATIVAN) 0.5 MG tablet Take 0.5 tablets by mouth as needed. anxiety 05/12/15   Historical Provider, MD  naproxen (NAPROSYN) 500 MG tablet Take 1 tablet (500 mg total) by mouth 2 (two) times daily with a meal. 07/10/15   Karie Soda, MD  oxyCODONE (OXY IR/ROXICODONE) 5 MG immediate release tablet Take 1-2 tablets (5-10 mg total) by mouth every 4 (four) hours as needed for moderate pain, severe pain or breakthrough pain. 07/10/15   Karie Soda, MD  potassium chloride (K-DUR) 10 MEQ tablet Take 1 tablet (10 mEq total) by mouth daily. 07/10/15   Tilden Fossa, MD   BP 110/75 mmHg  Pulse 90  Temp(Src) 99.1 F (37.3 C) (Oral)  Resp 16  SpO2 99% Physical Exam  Constitutional: She is oriented to person,  place, and time. She appears well-developed and well-nourished.  HENT:  Head: Normocephalic and atraumatic.  Cardiovascular: Normal rate and regular rhythm.   No murmur heard. Pulmonary/Chest: Effort normal and breath sounds normal. No respiratory distress.  Abdominal: Soft. There is no tenderness. There is no rebound and no guarding.  Genitourinary:  Moderate erythema and induration to the right perianal region with central pointing and fluctuance.    Musculoskeletal: She exhibits no edema or tenderness.  Neurological: She is alert and oriented to person, place, and time.  Skin: Skin is warm and dry.  Psychiatric: She has a normal mood and affect. Her behavior  is normal.  Nursing note and vitals reviewed.   ED Course  Procedures (including critical care time) Labs Review Labs Reviewed  BASIC METABOLIC PANEL - Abnormal; Notable for the following:    Sodium 134 (*)    Potassium 2.7 (*)    Chloride 98 (*)    All other components within normal limits  CBC WITH DIFFERENTIAL/PLATELET - Abnormal; Notable for the following:    WBC 12.4 (*)    MCHC 36.1 (*)    Neutro Abs 9.4 (*)    Monocytes Absolute 1.2 (*)    All other components within normal limits    Imaging Review No results found. I have personally reviewed and evaluated these images and lab results as part of my medical decision-making.   EKG Interpretation None      MDM   Final diagnoses:  Perianal abscess  Hypokalemia    Pt here for evaluation of rectal pain, exam with perirectal abscess.  Discussed the case with Dr. Michaell CowingGross with General Surgery, who evaluated the patient in the ED.  He performed bedside I and D and prescribed augmentin, pain medications.  Pt noted to be hypokalemic - likely secondary to HCTZ use, replaced in ED and she was prescribed kdur with planned PCP followup regarding hypokalemia.  Home care for perirectal abscess as well as return precautions discussed.     Tilden FossaElizabeth Markeisha Mancias, MD 07/10/15 1525

## 2015-07-10 NOTE — Consult Note (Signed)
[facility]  [address]  [place], [place] [address] Phone: [phone] FAX: [phone]     JNAI SNELLGROVE  [birth year] [id number]  CARE TEAM:  PCP: No primary care provider on file.  Outpatient Care Team: No care team member to display  Inpatient Treatment Team: Treatment Team: Attending Provider: Tilden Fossa, MD; Registered Nurse: Archer Asa, RN; Technician: Melrose Nakayama, [place]; Technician: Bruna Potter, EMT; Consulting Physician: Bishop Limbo, MD  This patient is a 55 y.o.female who presents today for surgical evaluation at the request of Dr Madilyn Hook.   Reason for evaluation: Recurrent anal pain and swelling.  Probable recurrent anorectal abscess.  55 year old woman with history of anal pain and infections.  Had examination under anesthesia with partial fistulotomy and seton placement in 2014 at [facility] in [facility] by Dr. Suzie Portela.  She recalls getting a colonoscopy at that time.  She was told that it was otherwise normal.  Seton placed at that time.  Patient notes it was removed in the office two weeks later.  Relocated to [place] area.  Had an anal abscess drained in [facility] surgery office January 2017.  Lost to follow-up.  Returned in May to [facility] office with suspicion of another collection.  Aspiration attempted in office but no pus found.  Suspicious perhaps for chronic fistula.  Being set up for MRI to see if requires operative reexploration electively by Dr. Maisie Fus.  That was two days ago.  In the meantime started on oral antibiotics.  The patient believes it was Bactrim.  Records no Bactrim and doxycycline prescriptions.  Patient denies any allergies.  She claims she is been trying to squeeze pus out with no result.  She felt worse.  Feels like there is a lump abscess for certain now.  Called this morning concern.  Because it was a holiday weekend, only option is emergency room.  Patient arrived.  Dr. Madilyn Hook  with [facility] emergency department suspects fluctuant abscess.  She is hesitant to do any surgical drainage given the concern of possible fistula.  Therefore surgical consultation requested.    No personal nor family history of GI/colon cancer, inflammatory bowel disease, irritable bowel syndrome, allergy such as Celiac Sprue, dietary/dairy problems, colitis, ulcers nor gastritis.  No recent sick contacts/gastroenteritis.  No travel outside the country.  No changes in diet.  No dysphagia to solids or liquids.  No significant heartburn or reflux.  No hematochezia, hematemesis, coffee ground emesis.  No evidence of prior gastric/peptic ulceration.  Normally has a bowel movement every day or so.  No severe bouts of constipation or diarrhea.      Past Medical History  Diagnosis Date  . Hypertension   . Anal abscess 2014, 2017    I&D [place] 2014, I&D in [place] Jan 2017  . SARCOIDOSIS 01/02/2007    Qualifier: Diagnosis of  By: Thad Ranger LPN, Megan    . History of ETOH abuse   . Dermatitis 07/10/2015    Dr [place] w dermatology.  ?Sarcoid related   . Bilateral sensorineural hearing loss 07/08/2015  . Subjective tinnitus 07/08/2015  . HYPERTENSION 01/02/2007    Qualifier: Diagnosis of  By: Thad Ranger LPN, Megan      History reviewed. No pertinent past surgical history.  Social History   Social History  . Marital Status: Single    Spouse Name: N/A  . Number of Children: N/A  . Years of Education: N/A   Occupational History  .  Not on file.   Social History Main Topics  . Smoking status: Never Smoker   . Smokeless tobacco: Not on file  . Alcohol Use: Yes     Comment: heavily  . Drug Use: No  . Sexual Activity: Not on file   Other Topics Concern  . Not on file   Social History Narrative    No family history on file.  Current Facility-Administered Medications  Medication Dose Route Frequency Provider Last Rate Last Dose  . lidocaine (XYLOCAINE) 1 % (with pres) injection             Current Outpatient Prescriptions  Medication Sig Dispense Refill  . atenolol (TENORMIN) 50 MG tablet Take by mouth.    . hydrochlorothiazide (HYDRODIURIL) 25 MG tablet Take by mouth.    Marland Kitchen LORazepam (ATIVAN) 0.5 MG tablet Take 0.5 tablets by mouth as needed. anxiety  0     No Known Allergies  ROS: Constitutional:  No fevers, chills, sweats.  Weight stable Eyes:  No vision changes, No discharge HENT:  No sore throats, nasal drainage Lymph: No neck swelling, No bruising easily Pulmonary:  No cough, productive sputum CV: No orthopnea, PND  Patient walks 30 minutes for about 1 miles without difficulty.  No exertional chest/neck/shoulder/arm pain. GI:  No personal nor family history of GI/colon cancer, inflammatory bowel disease, irritable bowel syndrome, allergy such as Celiac Sprue, dietary/dairy problems, colitis, ulcers nor gastritis.  No recent sick contacts/gastroenteritis.  No travel outside the country.  No changes in diet. Renal: No UTIs, No hematuria Genital:  No drainage, bleeding, masses Musculoskeletal: No severe joint pain.  Good ROM major joints Skin:  No sores or lesions.  No rashes Heme/Lymph:  No easy bleeding.  No swollen lymph nodes Neuro: No focal weakness/numbness.  No seizures Psych: No suicidal ideation.  No hallucinations  BP 117/79 mmHg  Pulse 92  Temp(Src) 98.2 F (36.8 C) (Oral)  Resp 18  SpO2 99%  Physical Exam: General: Pt awake/alert/oriented x4 in no major acute distress Eyes: PERRL, normal EOM. Sclera nonicteric Neuro: CN II-XII intact w/o focal sensory/motor deficits. Lymph: No head/neck/groin lymphadenopathy Psych:  No delerium/psychosis/paranoia HENT: Normocephalic, Mucus membranes moist.  No thrush Neck: Supple, No tracheal deviation Chest: No pain.  Good respiratory excursion. CV:  Pulses intact.  Regular rhythm Abdomen: Soft, Nondistended.  Nontender.  No incarcerated hernias. Genital: Normal external female genitalia.  No inguinal  lymphadenopathy.  No inguinal hernias.  Rectal: 8 x 6 cm area of inflammation and erythema on right anterior perirectal region and gluteus.  2 x 2 centimeter area of fluctuance very thin.  Just anterior to prior stellate scar in the right anterior perirectal region (around 10:00, 3 cm from the anal verge) consistent with prior incision and drainage areas.  No other scars seen.  Very tender.  Old off on any digital anorectal exam.  Drained at the bedside with local anesthetic.  See procedure note below.  Ext:  SCDs BLE.  No significant edema.  No cyanosis Skin: No petechiae / purpurea.  No major sores Musculoskeletal: No severe joint pain.  Good ROM major joints   Results:   Labs: Results for orders placed or performed during the hospital encounter of 07/10/15 (from the past 48 hour(s))  CBC with Differential     Status: Abnormal   Collection Time: 07/10/15 11:00 AM  Result Value Ref Range   WBC 12.4 (H) 4.0 - 10.5 K/uL   RBC 4.23 3.87 - 5.11 MIL/uL   Hemoglobin  14.0 12.0 - 15.0 g/dL   HCT 16.138.8 09.636.0 - 04.546.0 %   MCV 91.7 78.0 - 100.0 fL   MCH 33.1 26.0 - 34.0 pg   MCHC 36.1 (H) 30.0 - 36.0 g/dL   RDW 40.912.5 81.111.5 - 91.415.5 %   Platelets 302 150 - 400 K/uL   Neutrophils Relative % 76 %   Neutro Abs 9.4 (H) 1.7 - 7.7 K/uL   Lymphocytes Relative 13 %   Lymphs Abs 1.6 0.7 - 4.0 K/uL   Monocytes Relative 10 %   Monocytes Absolute 1.2 (H) 0.1 - 1.0 K/uL   Eosinophils Relative 1 %   Eosinophils Absolute 0.1 0.0 - 0.7 K/uL   Basophils Relative 0 %   Basophils Absolute 0.0 0.0 - 0.1 K/uL    Imaging / Studies: No results found.  Medications / Allergies: per chart  Antibiotics: Anti-infectives    None      Assessment  Meredith ModyLisa J Caporaso  55 y.o. female       Problem List:  Active Problems:   Perirectal abscess, recurrent   Transsphincteric anal fistula  Recurrent erythema pain and now fluctuance and old scar consistent with recurrent perirectal abscess.  Very suspicious for  fistula.  Sounds like had a transfer enteric fistula that required a seton in 2014.  Plan:  IV antibiotics.  Rocephin/Flagyl.  Switch to different oral antibiotics.  Incision and drainage:  The anatomy & physiology of the anorectal region was discussed.  The pathophysiology of anorectal abscess and differential diagnosis was discussed.  Natural history progression such as fistula, gangrene, etc was discussed.   I stressed the importance of a bowel regimen to have daily soft bowel movements to minimize progression of disease.     The patient's symptoms are not adequately controlled.  Non-operative treatment has not healed the abscess.  Therefore, I recommended incision & drainage of the abscess to allow the infection to resolve and heal.  Technique, risks, benefits, alternatives discussed.  The patient expressed understanding & wished to proceed.  The patient was positioned lateral decubitus.  Area was painted and draped sterilely.  A by 6 cm region of fluctuance.  I placed a field block with local anaesthetic 5 x 5 cm region along the most fluctuant area..  I incised the skin over the abscess to release the infection.  Immediately released a large volume of pus.  Clamp probed 7 cm more proximally external to the sphincters towards the posterior rectal wall.  I excised skin to create a 2 x 2 centimeter wound to have an adequate opening for drainage & prevent skin reclosure.  I did not pack the wound at this time as the skin was wide open.  The patient tolerated the procedure.  Felt much better once pus was released.  Induration and erythema improved.  Educational handouts further explaining the pathology, treatment options, and bowel regimen were given.  We will have the patient return to clinic for close follow up to make sure the infection heals.  I discussed with the patient.  The patient feels comfortable wishes to go home with close office follow-up.  I did note if she does not improve, she gets  readmitted with possible operative reexploration.  I did not note that most likely she has a complex fistula given the recurrent infections in this will require operative exploration and treatment.  However we need to get the abscess drained in the current bout of cellulitis controlled before considering more definitive surgical treatment I discussed  with the patient's nurse.  I discussed with one of the physician assistants were relayed to Dr. Madilyn Hook who is not immediately available.   -VTE prophylaxis- SCDs, etc  -mobilize as tolerated to help recovery    Ardeth Sportsman, M.D., F.A.C.S. Gastrointestinal and Minimally Invasive Surgery [facility] [address] [place], [place] [address] [phone] Main / Paging   07/10/2015  Note: Portions of this report may have been transcribed using voice recognition software. Every effort was made to ensure accuracy; however, inadvertent computerized transcription errors may be present.   Any transcriptional errors that result from this process are unintentional.

## 2015-07-10 NOTE — ED Notes (Signed)
MD at bedside. 

## 2015-07-10 NOTE — Telephone Encounter (Signed)
Miranda ModyLisa J Dominguez  03/27/1960 [id number]  No care team member to display  This patient is a 55 y.o.female who calls today for surgical evaluation.   Reason for call: Worsening anal pain.  Patient with history of perirectal abscess drained in 2014.  January 2017.  Recurrent pain.  Suspicious for recurrent absence.  Seen in office.  No definite fluctuance able to be drained.  Consideration for MRI to rule out chronic fistula.  Placed on oral antibiotics.  That was two days ago.  Patient notes she has worsening pain and swelling despite being on antibiotics.  She thinks its Bactrim.  I recommended evaluation emergency room.  See if there is a deeper abscess or requires bedside versus operative drainage.  Patient Active Problem List   Diagnosis Date Noted  . SARCOIDOSIS 01/02/2007  . HYPERTENSION 01/02/2007    Past Medical History  Diagnosis Date  . Hypertension     No past surgical history on file.  Social History   Social History  . Marital Status: Single    Spouse Name: N/A  . Number of Children: N/A  . Years of Education: N/A   Occupational History  . Not on file.   Social History Main Topics  . Smoking status: Never Smoker   . Smokeless tobacco: Not on file  . Alcohol Use: Yes     Comment: heavily  . Drug Use: No  . Sexual Activity: Not on file   Other Topics Concern  . Not on file   Social History Narrative  . No narrative on file    No family history on file.  Current Outpatient Prescriptions  Medication Sig Dispense Refill  . atenolol (TENORMIN) 50 MG tablet Take by mouth.    . hydrochlorothiazide (HYDRODIURIL) 25 MG tablet Take by mouth.    Marland Kitchen. LORazepam (ATIVAN) 0.5 MG tablet Take 0.5 tablets by mouth as needed. anxiety  0   No current facility-administered medications for this visit.     No Known Allergies  @VS @  No results found.  Note: This dictation was prepared with Dragon/digital dictation along with [employer] technology. Any  transcriptional errors that result from this process are unintentional.

## 2015-07-10 NOTE — ED Notes (Signed)
MD aware of patient's potassium level

## 2015-07-10 NOTE — Discharge Instructions (Signed)
ANORECTAL SURGERY:  POST OPERATIVE INSTRUCTIONS  1. Take your usually prescribed home medications unless otherwise directed. 2. DIET: Follow a light bland diet the first 24 hours after arrival home, such as soup, liquids, crackers, etc.  Be sure to include lots of fluids daily.  Avoid fast food or heavy meals as your are more likely to get nauseated.  Eat a low fat the next few days after surgery.   3. PAIN CONTROL: a. Pain is best controlled by a usual combination of three different methods TOGETHER: i. Ice/Heat ii. Over the counter pain medication iii. Prescription pain medication b. Most patients will experience some swelling and discomfort in the anus/rectal area. and incisions.  Ice packs or heat (30-60 minutes up to 6 times a day) will help. Use ice for the first few days to help decrease swelling and bruising, then switch to heat such as warm towels, sitz baths, warm baths, etc to help relax tight/sore spots and speed recovery.  Some people prefer to use ice alone, heat alone, alternating between ice & heat.  Experiment to what works for you.  Swelling and bruising can take several weeks to resolve.   c. It is helpful to take an over-the-counter pain medication regularly for the first few weeks.  Choose one of the following that works best for you: i. Naproxen (Aleve, etc)  Two '220mg'$  tabs twice a day ii. Ibuprofen (Advil, etc) Three '200mg'$  tabs four times a day (every meal & bedtime) iii. Acetaminophen (Tylenol, etc) 500-'650mg'$  four times a day (every meal & bedtime) d. A  prescription for pain medication (such as oxycodone, hydrocodone, etc) should be given to you upon discharge.  Take your pain medication as prescribed.  i. If you are having problems/concerns with the prescription medicine (does not control pain, nausea, vomiting, rash, itching, etc), please call us [phone] to see if we need to switch you to a different pain medicine that will work better for you and/or control your  side effect better. ii. If you need a refill on your pain medication, please contact your pharmacy.  They will contact our office to request authorization. Prescriptions will not be filled after 5 pm or on week-ends.  Use a Sitz Bath 4-8 times a day for relief   [employer] A sitz bath is a warm water bath taken in the sitting position that covers only the hips and buttocks. It may be used for either healing or hygiene purposes. Sitz baths are also used to relieve pain, itching, or muscle spasms. The water may contain medicine. Moist heat will help you heal and relax.  HOME CARE INSTRUCTIONS  Take 3 to 4 sitz baths a day.  Fill the bathtub half full with warm water.  Sit in the water and open the drain a little.  Turn on the warm water to keep the tub half full. Keep the water running constantly.  Soak in the water for 15 to 20 minutes.  After the sitz bath, pat the affected area dry first.   4. KEEP YOUR BOWELS REGULAR a. The goal is one bowel movement a day b. Avoid getting constipated.  Between the surgery and the pain medications, it is common to experience some constipation.  Increasing fluid intake and taking a fiber supplement (such as Metamucil, Citrucel, FiberCon, MiraLax, etc) 1-2 times a day regularly will usually help prevent this problem from occurring.  A mild laxative (prune juice, Milk of Magnesia, MiraLax, etc) should be taken according to package  directions if there are no bowel movements after 48 hours. c. Watch out for diarrhea.  If you have many loose bowel movements, simplify your diet to bland foods & liquids for a few days.  Stop any stool softeners and decrease your fiber supplement.  Switching to mild anti-diarrheal medications (Kayopectate, Pepto Bismol) can help.  If this worsens or does not improve, please call us.  5. Wound Care  a. Remove your bandages the day after surgery.  Unless discharge instructions indicate otherwise, leave your bandage dry and in  place overnight.  Remove the bandage during your first bowel movement.   b. Wear an absorbent pad or soft cotton gauze in your underwear as needed to catch any drainage and help keep the area  c. Keep the area clean and dry.  Bathe / shower every day.  Keep the area clean by showering / bathing over the incision / wound.   It is okay to soak an open wound to help wash it.  Wet wipes or showers / gentle washing after bowel movements is often less traumatic than regular toilet paper. d. Dennis Bast will often notice bleeding with bowel movements.  This should slow down by the end of the first week of surgery e. Expect some drainage.  This should slow down, too, by the end of the first week of surgery.  Wear an absorbent pad or soft cotton gauze in your underwear until the drainage stops.  6. ACTIVITIES as tolerated:   a. You may resume regular (light) daily activities beginning the next day--such as daily self-care, walking, climbing stairs--gradually increasing activities as tolerated.  If you can walk 30 minutes without difficulty, it is safe to try more intense activity such as jogging, treadmill, bicycling, low-impact aerobics, swimming, etc. b. Save the most intensive and strenuous activity for last such as sit-ups, heavy lifting, contact sports, etc  Refrain from any heavy lifting or straining until you are off narcotics for pain control.   c. DO NOT PUSH THROUGH PAIN.  Let pain be your guide: If it hurts to do something, don't do it.  Pain is your body warning you to avoid that activity for another week until the pain goes down. d. You may drive when you are no longer taking prescription pain medication, you can comfortably sit for long periods of time, and you can safely maneuver your car and apply brakes. e. Dennis Bast may have sexual intercourse when it is comfortable.  7. FOLLOW UP in our office a. Please call [facility] at [phone] to set up an appointment to see your surgeon in the office for a follow-up  appointment approximately 2 weeks after your surgery. b. Make sure that you call for this appointment the day you arrive home to insure a convenient appointment time. 10. IF YOU HAVE DISABILITY OR FAMILY LEAVE FORMS, BRING THEM TO THE OFFICE FOR PROCESSING.  DO NOT GIVE THEM TO YOUR DOCTOR.        WHEN TO CALL us [phone]: 1. Poor pain control 2. Reactions / problems with new medications (rash/itching, nausea, etc)  3. Fever over 101.5 F (38.5 C) 4. Inability to urinate 5. Nausea and/or vomiting 6. Worsening swelling or bruising 7. Continued bleeding from incision. 8. Increased pain, redness, or drainage from the incision  The clinic staff is available to answer your questions during regular business hours (8:30am-5pm).  Please dont hesitate to call and ask to speak to one of our nurses for clinical concerns.   A  surgeon from [facility] is always on call at the hospitals   If you have a medical emergency, go to the nearest emergency room or call 911.    [facility] [address], [address], [place], [place]  [id number] ? MAIN: [phone] ? TOLL FREE: [phone] ? FAX [phone] [email or web address]   Perianal Abscess An abscess is an infected area that contains a collection of pus and debris. A perianal abscess is one that occurs in the perineal area, which is the area between the anus and the scrotum in males and between the anus and the vagina in females. Perianal abscesses can vary in size. Without treatment, a perianal abscess can become larger and cause other problems. CAUSES  Glands in the perineal area can become plugged up with debris. When this happens, an abscess may form.  SIGNS AND SYMPTOMS  The most common symptoms of a perianal abscess are:  Swelling and redness in the area of the abscess. The redness may go beyond the abscess and appear as a red streak on the skin.  Pain in the area of the  abscess. Other possible symptoms include:   A visible lump or a lump that can be felt when touching the area and is usually painful.  Bleeding or pus-like discharge from the area.  Fever.  General weakness. DIAGNOSIS  Your health care provider will take a medical history and examine the area. This may involve examining the rectal area with a gloved hand (digital rectal exam). For women, it may require a careful vaginal exam. Sometimes, the health care provider needs to look into the rectum using a probe or scope. TREATMENT  Treatment often requires making a cut (incision) in the abscess to drain the pus. This can sometimes be done in your health care provider's office or an emergency department after giving you medicine to numb the area (local anesthetic). For larger or deeper abscesses, surgery may be required to drain the abscess. Antibiotic medicines are sometimes given if there is infection of the surrounding tissue (cellulitis). In some cases, gauze is packed into the abscess to continue draining the area. Frequent sitz baths may be recommended to help the wound heal and to reduce the chance of the abscess coming back. HOME CARE INSTRUCTIONS   Only take over-the-counter or prescription medicines for pain, fever, or discomfort as directed by your health care provider.  Take antibiotic medicine as directed. Make sure you finish it even if you start to feel better.  If gauze is used in the abscess, follow your health care provider's instructions for removing or changing the gauze. It can usually be removed in 2-3 days.  If one or more drains have been placed in the abscess cavity, be careful not to pull at them. Your health care provider will tell you how long they need to remain in place.  Take warm sitz baths 3-4 times a day and after bowel movements. This will help reduce pain and swelling.  Keep the skin around the abscess clean and dry. Avoid cleaning the area too much.  Avoid  scratching the abscess area.  Avoid using colored or perfumed toilet papers. SEEK MEDICAL CARE IF:   You have trouble having a bowel movement or passing urine.  Your pain or swelling in the affected area does not seem to be improving.  The gauze packing or the drains come out before the planned time. SEEK IMMEDIATE MEDICAL CARE IF:   You have problems moving  or using your legs.  You have severe or increasing pain.  Your swelling in the affected area suddenly gets worse.  You have a large increase in bleeding or passing of pus.  You have chills or a fever. MAKE SURE YOU:   Understand these instructions.  Will watch your condition.  Will get help right away if you are not doing well or get worse.   This information is not intended to replace advice given to you by your health care provider. Make sure you discuss any questions you have with your health care provider.   Document Released: 03/07/2006 Document Revised: 11/19/2012 Document Reviewed: 09/10/2012 Elsevier Interactive Patient Education 2016 Else[employer] Inc[employer]  Anal Fistula An anal fistula is an abnormal tunnel that develops between the bowel and the skin near the outside of the anus, where stool (feces) comes out. The anus has many tiny glands that make lubricating fluid. Sometimes, these glands become plugged and infected, and that can cause a fluid-filled pocket (abscess) to form. An anal fistula often develops after this infection or abscess. CAUSES In most cases, an anal fistula is caused by a past or current anal abscess. Other causes include:  A complication of surgery.  Trauma to the rectal area.  Radiation to the area.  Medical conditions or diseases, such as:  Chronic inflammatory bowel disease, such as Crohn disease or ulcerative colitis.  Colon cancer or rectal cancer.  Diverticular disease, such as diverticulitis.  An STD (sexually transmitted disease), such as gonorrhea, chlamydia, or  syphilis.  An infection that is caused by HIV (human immunodeficiency virus).  Foreign body in the rectum. SYMPTOMS Symptoms of this condition include:  Throbbing or constant pain that may be worse while you are sitting.  Swelling or irritation around the anus.  Drainage of pus or blood from an opening near the anus.  Pain with bowel movements.  Fever or chills. DIAGNOSIS Your health care provider will examine the area to find the openings of the anal fistula and the fistula tract. The external opening of the anal fistula may be seen during a physical exam. You may also have tests, including:  An exam of the rectal area with a gloved hand (digital rectal exam).  An exam with a probe or scope to help locate the internal opening of the fistula.  Imaging tests to find the exact location and path of the fistula. These tests may include X-rays, an ultrasound, a CT scan, or MRI. The path is made visible by a dye that is injected into the fistula opening. You may have other tests to find the cause of the anal fistula. TREATMENT The most common treatment for an anal fistula is surgery. The type of surgery that is used will depend on where the fistula is located and how complex the fistula is. Surgical options include:  A fistulotomy. The whole fistula is opened up, and the contents are drained to promote healing.  Seton placement. A silk string (seton) is placed into the fistula during a fistulotomy. This helps to drain any infection to promote healing.  Advancement flap procedure. Tissue is removed from your rectum or the skin around the anus and is attached to the opening of the fistula.  Bioprosthetic plug. A cone-shaped plug is made from your tissue and is used to block the opening of the fistula. Some anal fistulas do not require surgery. A nonsurgical treatment option involves injecting a fibrin glue to seal the fistula. You also may be prescribed an antibiotic  medicine to treat an  infection. HOME CARE INSTRUCTIONS Medicines  Take over-the-counter and prescription medicines only as told by your health care provider.  If you were prescribed an antibiotic medicine, take it as told by your health care provider. Do not stop taking the antibiotic even if you start to feel better.  Use a stool softener or a laxative if told to do so by your health care provider. General Instructions  Eat a high-fiber diet as told by your health care provider. This can help to prevent constipation.  Drink enough fluid to keep your urine clear or pale yellow.  Take a warm sitz bath for 15-20 minutes, 3-4 times per day, or as told by your health care provider. Sitz baths can ease your pain and discomfort and help with healing.  Follow good hygiene to keep the anal area as clean and dry as possible. Use wet toilet paper or a moist towelette after each bowel movement.  Keep all follow-up visits as told by your health care provider. This is important. SEEK MEDICAL CARE IF:  You have increased pain that is not controlled with medicines.  You have new redness or swelling around the anal area.  You have new fluid, blood, or pus coming from the anal area.  You have tenderness or warmth around the anal area. SEEK IMMEDIATE MEDICAL CARE IF:  You have a fever.  You have severe pain.  You have chills or diarrhea.  You have severe problems urinating or having a bowel movement.   This information is not intended to replace advice given to you by your health care provider. Make sure you discuss any questions you have with your health care provider.   Document Released: 01/12/2008 Document Revised: 10/20/2014 Document Reviewed: 04/26/2014 Elsevier Interactive Patient Education 2016 [employer] Inc.  Managing Pain  Pain after surgery or related to activity is often due to strain/injury to muscle, tendon, nerves and/or incisions.  This pain is usually short-term and will improve in a few  months.   Many people find it helpful to do the following things TOGETHER to help speed the process of healing and to get back to regular activity more quickly:  1. Avoid heavy physical activity at first a. No lifting greater than 20 pounds at first, then increase to lifting as tolerated over the next few weeks b. Do not push through the pain.  Listen to your body and avoid positions and maneuvers than reproduce the pain.  Wait a few days before trying something more intense c. Walking is okay as tolerated, but go slowly and stop when getting sore.  If you can walk 30 minutes without stopping or pain, you can try more intense activity (running, jogging, aerobics, cycling, swimming, treadmill, sex, sports, weightlifting, etc ) d. Remember: If it hurts to do it, then dont do it!  2. Take Anti-inflammatory medication a. Choose ONE of the following over-the-counter medications: i.            Acetaminophen 500mg  tabs (Tylenol) 1-2 pills with every meal and just before bedtime (avoid if you have liver problems) ii.            Naproxen 220mg  tabs (ex. Aleve) 1-2 pills twice a day (avoid if you have kidney, stomach, IBD, or bleeding problems) iii. Ibuprofen 200mg  tabs (ex. Advil, Motrin) 3-4 pills with every meal and just before bedtime (avoid if you have kidney, stomach, IBD, or bleeding problems) b. Take with food/snack around the clock for 1-2 weeks i.  This helps the muscle and nerve tissues become less irritable and calm down faster  3. Use a Heating pad or Ice/Cold Pack a. 4-6 times a day b. May use warm bath/hottub  or showers  4. Try Gentle Massage and/or Stretching  a. at the area of pain many times a day b. stop if you feel pain - do not overdo it  Try these steps together to help you body heal faster and avoid making things get worse.  Doing just one of these things may not be enough.    If you are not getting better after two weeks or are noticing you are getting worse, contact our  office for further advice; we may need to re-evaluate you & see what other things we can do to help.  GETTING TO GOOD BOWEL HEALTH. Irregular bowel habits such as constipation and diarrhea can lead to many problems over time.  Having one soft bowel movement a day is the most important way to prevent further problems.  The anorectal canal is designed to handle stretching and feces to safely manage our ability to get rid of solid waste (feces, poop, stool) out of our body.  BUT, hard constipated stools can act like ripping concrete bricks and diarrhea can be a burning fire to this very sensitive area of our body, causing inflamed hemorrhoids, anal fissures, increasing risk is perirectal abscesses, abdominal pain/bloating, an making irritable bowel worse.      The goal: ONE SOFT BOWEL MOVEMENT A DAY!  To have soft, regular bowel movements:   Drink plenty of fluids, consider 4-6 tall glasses of water a day.    Take plenty of fiber.  Fiber is the undigested part of plant food that passes into the colon, acting s natures broom to encourage bowel motility and movement.  Fiber can absorb and hold large amounts of water. This results in a larger, bulkier stool, which is soft and easier to pass. Work gradually over several weeks up to 6 servings a day of fiber (25g a day even more if needed) in the form of: o Vegetables -- Root (potatoes, carrots, turnips), leafy green (lettuce, salad greens, celery, spinach), or cooked high residue (cabbage, broccoli, etc) o Fruit -- Fresh (unpeeled skin & pulp), Dried (prunes, apricots, cherries, etc ),  or stewed ( applesauce)  o Whole grain breads, pasta, etc (whole wheat)  o Bran cereals   Bulking Agents -- This type of water-retaining fiber generally is easily obtained each day by one of the following:  o Psyllium bran -- The psyllium plant is remarkable because its ground seeds can retain so much water. This product is available as Metamucil, Konsyl, Effersyllium, Per  Diem Fiber, or the less expensive generic preparation in drug and health food stores. Although labeled a laxative, it really is not a laxative.  o Methylcellulose -- This is another fiber derived from wood which also retains water. It is available as Citrucel. o Polyethylene Glycol - and artificial fiber commonly called Miralax or Glycolax.  It is helpful for people with gassy or bloated feelings with regular fiber o Flax Seed - a less gassy fiber than psyllium  No reading or other relaxing activity while on the toilet. If bowel movements take longer than 5 minutes, you are too constipated  AVOID CONSTIPATION.  High fiber and water intake usually takes care of this.  Sometimes a laxative is needed to stimulate more frequent bowel movements, but   Laxatives are not a good long-term solution  as it can wear the colon out.  They can help jump-start bowels if constipated, but should be relied on constantly without discussing with your doctor o Osmotics (Milk of Magnesia, Fleets phosphosoda, Magnesium citrate, MiraLax, GoLytely) are safer than  o Stimulants (Senokot, Castor Oil, Dulcolax, Ex Lax)    o Avoid taking laxatives for more than 7 days in a row.   IF SEVERELY CONSTIPATED, try a Bowel Retraining Program: o Do not use laxatives.  o Eat a diet high in roughage, such as bran cereals and leafy vegetables.  o Drink six (6) ounces of prune or apricot juice each morning.  o Eat two (2) large servings of stewed fruit each day.  o Take one (1) heaping tablespoon of a psyllium-based bulking agent twice a day. Use sugar-free sweetener when possible to avoid excessive calories.  o Eat a normal breakfast.  o Set aside 15 minutes after breakfast to sit on the toilet, but do not strain to have a bowel movement.  o If you do not have a bowel movement by the third day, use an enema and repeat the above steps.   Controlling diarrhea o Switch to liquids and simpler foods for a few days to avoid stressing  your intestines further. o Avoid dairy products (especially milk & ice cream) for a short time.  The intestines often can lose the ability to digest lactose when stressed. o Avoid foods that cause gassiness or bloating.  Typical foods include beans and other legumes, cabbage, broccoli, and dairy foods.  Every person has some sensitivity to other foods, so listen to our body and avoid those foods that trigger problems for you. o Adding fiber (Citrucel, Metamucil, psyllium, Miralax) gradually can help thicken stools by absorbing excess fluid and retrain the intestines to act more normally.  Slowly increase the dose over a few weeks.  Too much fiber too soon can backfire and cause cramping & bloating. o Probiotics (such as active yogurt, Align, etc) may help repopulate the intestines and colon with normal bacteria and calm down a sensitive digestive tract.  Most studies show it to be of mild help, though, and such products can be costly. o Medicines: - Bismuth subsalicylate (ex. Kayopectate, Pepto Bismol) every 30 minutes for up to 6 doses can help control diarrhea.  Avoid if pregnant. - Loperamide (Immodium) can slow down diarrhea.  Start with two tablets (  total) first and then try one tablet every 6 hours.  Avoid if you are having fevers or severe pain.  If you are not better or start feeling worse, stop all medicines and call your doctor for advice o Call your doctor if you are getting worse or not better.  Sometimes further testing (cultures, endoscopy, X-ray studies, bloodwork, etc) may be needed to help diagnose and treat the cause of the diarrhea.  TROUBLESHOOTING IRREGULAR BOWELS 1) Avoid extremes of bowel movements (no bad constipation/diarrhea) 2) Miralax 17gm mixed in 8oz. water or juice-daily. May use BID as needed.  3) Gas-x,Phazyme, etc. as needed for gas & bloating.  4) Soft,bland diet. No spicy,greasy,fried foods.  5) Prilosec over-the-counter as needed  6) May hold gluten/wheat  products from diet to see if symptoms improve.  7)  May try probiotics (Align, Activa, etc) to help calm the bowels down 7) If symptoms become worse call back immediately.

## 2015-07-10 NOTE — ED Notes (Signed)
Discharge instructions, follow up care, and rx x1 reviewed with patient. Patient verbalized understanding. 

## 2015-07-10 NOTE — ED Notes (Signed)
Per pt, states perianal abscess-was seen by surgery on Friday-was given antibiotics-did not have anything done=thought it would clear up with antibiotics-getting larger

## 2015-07-10 NOTE — ED Notes (Signed)
Notified RN of abnormal lab 

## 2015-09-27 ENCOUNTER — Ambulatory Visit (INDEPENDENT_AMBULATORY_CARE_PROVIDER_SITE_OTHER): Payer: BLUE CROSS/BLUE SHIELD

## 2015-09-27 ENCOUNTER — Ambulatory Visit (INDEPENDENT_AMBULATORY_CARE_PROVIDER_SITE_OTHER): Payer: BLUE CROSS/BLUE SHIELD | Admitting: Podiatry

## 2015-09-27 ENCOUNTER — Encounter: Payer: Self-pay | Admitting: Podiatry

## 2015-09-27 VITALS — BP 112/71 | HR 75 | Resp 16

## 2015-09-27 DIAGNOSIS — Q828 Other specified congenital malformations of skin: Secondary | ICD-10-CM

## 2015-09-27 DIAGNOSIS — S76309A Unspecified injury of muscle, fascia and tendon of the posterior muscle group at thigh level, unspecified thigh, initial encounter: Secondary | ICD-10-CM | POA: Diagnosis not present

## 2015-09-27 DIAGNOSIS — M7662 Achilles tendinitis, left leg: Secondary | ICD-10-CM | POA: Diagnosis not present

## 2015-09-27 DIAGNOSIS — M2040 Other hammer toe(s) (acquired), unspecified foot: Secondary | ICD-10-CM | POA: Diagnosis not present

## 2015-09-27 MED ORDER — DICLOFENAC SODIUM 1 % TD GEL: 4.0000 g | Freq: Four times a day (QID) | TRANSDERMAL | 4 refills | Status: DC

## 2015-09-27 NOTE — Progress Notes (Signed)
   Subjective:    Patient ID: Miranda Dominguez, female    DOB: 19-Jan-1961, 55 y.o.   MRN: [id number]  HPI: She presents today with multiple concerns she states that she is really not complaining about pain anywhere but she has noticed some changes and like to understand them. She states that her second toes are hammer toe and seemed to be drifting towards the big toe. She notices that the first and toe worst and second toes are really close together and she is also concerned about a fifth metatarsophalangeal joint callus bilaterally. She states that her knuckle on her fifth metatarsophalangeal joint right foot is more swollen and 6 out. She states is tender when she's walking. She also has pulling of her posterior heels bilaterally was on to state that she has severely tight hamstrings. She states that her brother has toes that cross over his big toe.    Review of Systems  HENT: Positive for hearing loss.   All other systems reviewed and are negative.      Objective:   Physical Exam: Vital signs are stable she is alert and oriented 3 with no apparent distress.  Pulses are strongly palpable neurologic sensorium is intact deep tendon reflexes are intact muscle strength +5 over 5 dorsiflexion plantar flexors and inverters and everters all intrinsic musculature is intact. Orthopedic evaluation there was result just distal to the ankle for range of motion without crepitus mild hallux abductovalgus deformity bilaterally elongated second and third toes with elongated second and third metatarsals resulting in hammertoe deformity and medial deviation of the toes she has some tenderness on end range of motion of the second and third metatarsophalangeal joints bilaterally. 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 Lateral callus and porokeratotic lesion is noted on the lateral aspect of the right versus plantar aspect of the left fifth metatarsophalangeal joint. These are  minimal and there is no skin breakdown.  Radiographs taken today demonstrate hallux abductovalgus deformity with elongated second and third metatarsals longer than normal. This is resulting in the hammertoe deformity and the pre-dislocation syndrome of the second metatarsophalangeal joint. Tailor's bunion deformity greater on the right side than the left with lateral bowing. No major osteoarthritic changes other than the digits. Radiographs do demonstrate small posterior calcaneal heel spurs no thickening of the Achilles tendon.  She does have very tight hamstrings and gastroc equinus with a likely resulting in her posterior heel pain.        Assessment & Plan:  Tight hamstrings tight gastroc leading to Achilles tenderness. 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  Plan: Discussed etiology pathology concerned versus surgical therapies. Discussed surgical options with her in great detail today also provided her with stretching exercises and diclofenac gel. She will notify me when she is ready for surgical intervention.

## 2015-09-27 NOTE — Patient Instructions (Addendum)
Hamstring Strain With Rehab The hamstring muscle and tendons are vulnerable to muscle or tendon tear (strain). Hamstring tears cause pain and inflammation in the backside of the thigh, where the hamstring muscles are located. The hamstring is comprised of three muscles that are responsible for straightening the hip, bending the knee, and stabilizing the knee. These muscles are important for walking, running, and jumping. Hamstring strain is the most common injury of the thigh. Hamstring strains are classified as grade 1 or 2 strains. Grade 1 strains cause pain, but the tendon is not lengthened. Grade 2 strains include a lengthened ligament due to the ligament being stretched or partially ruptured. With grade 2 strains there is still function, although the function may be decreased.  SYMPTOMS   Pain, tenderness, swelling, warmth, or redness over the hamstring muscles, at the back of the thigh.  Pain that gets worse during and after intense activity.  A "pop" heard in the area, at the time of injury.  Muscle spasm in the hamstring muscles.  Pain or weakness with running, jumping, or bending the knee against resistance.  Crackling sound (crepitation) when the tendon is moved or touched.  Bruising (contusion) in the thigh within 48 hours of injury.  Loss of fullness of the muscle, or area of muscle bulging in the case of a complete rupture. CAUSES  A muscle strain occurs when a force is placed on the muscle or tendon that is greater than it can withstand. Common causes of injury include:  Strain from overuse or sudden increase in the frequency, duration, or intensity of activity.  Single violent blow or force to the back of the knee or the hamstring area of the thigh. RISK INCREASES WITH:  Sports that require quick starts (sprinting, racquetball, tennis).  Sports that require jumping (basketball, volleyball).  Kicking sports and water skiing.  Contact sports (soccer, football).  Poor  strength and flexibility.  Failure to warm up properly before activity.  Previous thigh, knee, or pelvis injury.  Poor exercise technique.  Poor posture. PREVENTION  Maintain physical fitness:  Strength, flexibility, and endurance.  Cardiovascular fitness.  Learn and use proper exercise technique and posture.  Wear proper fitted and padded protective equipment. PROGNOSIS  If treated properly, hamstring strains are usually curable in 2 to 6 weeks. RELATED COMPLICATIONS   Longer healing time, if not properly treated or if not given adequate time to heal.  Chronically inflamed tendon, causing persistent pain with activity that may progress to constant pain.  Recurring symptoms, if activity is resumed too soon.  Vulnerable to repeated injury (in up to 25% of cases). TREATMENT  Treatment first involves the use of ice and medication to help reduce pain and inflammation. It is also important to complete strengthening and stretching exercises, as well as modifying any activities that aggravate the symptoms. These exercises may be completed at home or with a therapist. Your caregiver may recommend the use of crutches to help reduce pain and discomfort, especially is the strain is severe enough to cause limping. If the tendon has pulled away from the bone, then surgery is necessary to reattach it. MEDICATION   If pain medicine is needed, nonsteroidal anti-inflammatory medicines (aspirin and ibuprofen), or other minor pain relievers (acetaminophen), are often advised.  Do not take pain medicine for 7 days before surgery.  Prescription pain relievers may be given if your caregiver thinks they are needed. Use only as directed and only as much as you need.  Corticosteroid injections may be   recommended. However, these injections should only be used for serious cases, as they can only be given a certain number of times.  Ointments applied to the skin may be beneficial. HEAT AND  COLD  Cold treatment (icing) relieves pain and reduces inflammation. Cold treatment should be applied for 10 to 15 minutes every 2 to 3 hours, and immediately after activity that aggravates your symptoms. Use ice packs or an ice massage.  Heat treatment may be used before performing the stretching and strengthening activities prescribed by your caregiver, physical therapist, or athletic trainer. Use a heat pack or a warm water soak. SEEK MEDICAL CARE IF:   Symptoms get worse or do not improve in 2 weeks, despite treatment.  New, unexplained symptoms develop. (Drugs used in treatment may produce side effects.) EXERCISES RANGE OF MOTION (ROM) AND STRETCHING EXERCISES - Hamstring Strain These exercises may help you when beginning to rehabilitate your injury. Your symptoms may go away with or without further involvement from your physician, physical therapist or athletic trainer. While completing these exercises, remember:   Restoring tissue flexibility helps normal motion to return to the joints. This allows healthier, less painful movement and activity.  An effective stretch should be held for at least 30 seconds.  A stretch should never be painful. You should only feel a gentle lengthening or release in the stretched tissue. STRETCH - Hamstrings, Standing  Stand or sit, and extend your right / left leg, placing your foot on a chair or foot stool.  Keep a slight arch in your low back and your hips straight forward.  Lead with your chest, and lean forward at the waist until you feel a gentle stretch in the back of your right / left knee or thigh. (When done correctly, this exercise requires leaning only a small distance.)  Hold this position for __________ seconds. Repeat __________ times. Complete this stretch __________ times per day. STRETCH - Hamstrings, Supine   Lie on your back. Loop a belt or towel over the ball of your right / left foot.  Straighten your right / left knee and  slowly pull on the belt to raise your leg. Do not allow the right / left knee to bend. Keep your opposite leg flat on the floor.  Raise the leg until you feel a gentle stretch behind your right / left knee or thigh. Hold this position for __________ seconds. Repeat __________ times. Complete this stretch __________ times per day.  STRETCH - Hamstrings, Doorway  Lie on your back with your right / left leg extended and resting on the wall, and the opposite leg flat on the ground through the door. Initially, position your bottom farther away from the wall.  Keep your right / left knee straight. If you feel a stretch behind your knee or thigh, hold this position for __________ seconds.  If you do not feel a stretch, scoot your bottom closer to the door and hold __________ seconds. Repeat __________ times. Complete this stretch __________ times per day.  STRETCH - Hamstrings/Adductors, V-Sit   Sit on the floor with your legs extended in a large "V," keeping your knees straight.  With your head and chest upright, bend at your waist reaching for your left foot to stretch your right thigh muscles.  You should feel a stretch in your right inner thigh. Hold for __________ seconds.  Return to the upright position to relax your leg muscles.  Continuing to keep your chest upright, bend straight forward at your  waist to stretch your hamstrings.  You should feel a stretch behind both of your thighs and knees. Hold for __________ seconds.  Return to the upright position to relax your leg muscles.  With your head and chest upright, bend at your waist reaching for your right foot to stretch your left thigh muscles.  You should feel a stretch in your left inner thigh. Hold for __________ seconds.  Return to the upright position to relax your leg muscles. Repeat __________ times. Complete this exercise __________ times per day.  STRENGTHENING EXERCISES - Hamstring Strain These exercises may help you  when beginning to rehabilitate your injury. They may resolve your symptoms with or without further involvement from your physician, physical therapist or athletic trainer. While completing these exercises, remember:   Muscles can gain both the endurance and the strength needed for everyday activities through controlled exercises.  Complete these exercises as instructed by your physician, physical therapist or athletic trainer. Increase the resistance and repetitions only as guided.  You may experience muscle soreness or fatigue, but the pain or discomfort you are trying to eliminate should never get worse during these exercises. If this pain does get worse, stop and make certain you are following the directions exactly. If the pain is still present after adjustments, discontinue the exercise until you can discuss the trouble with your clinician. STRENGTH - Hip Extensors, Straight Leg Raises   Lie on your stomach on a firm surface.  Tense the muscles in your buttocks to lift your right / left leg about 4 inches. If you cannot lift your leg this high without arching your back, place a pillow under your hips.  Keep your knee straight. Hold for __________ seconds.  Slowly lower your leg to the starting position and allow it to relax completely before starting the next repetition. Repeat __________ times. Complete this exercise __________ times per day.  STRENGTH - Hamstring, Isometrics   Lie on your back on a firm surface.  Bend your right / left knee approximately __________ degrees.  Dig your heel into the surface, as if you are trying to pull it toward your buttocks. Tighten the muscles in the back of your thighs to "dig" as hard as you can, without increasing any pain.  Hold this position for __________ seconds.  Release the tension gradually and allow your muscles to completely relax for __________ seconds between each exercise. Repeat __________ times. Complete this exercise __________  times per day.  STRENGTH - Hamstring, Curls   Lay on your stomach with your legs extended. (If you lay on a bed, your feet may hang over the edge.)  Tighten the muscles in the back of your thigh to bend your right / left knee up to 90 degrees. Keep your hips flat on the bed or floor.  Hold this position for __________ seconds.  Slowly lower your leg back to the starting position. Repeat __________ times. Complete this exercise __________ times per day.  OPTIONAL ANKLE WEIGHTS: Begin with ____________________, but DO NOT exceed ____________________. Increase in 1 pound/0.5 kilogram increments.   This information is not intended to replace advice given to you by your health care provider. Make sure you discuss any questions you have with your health care provider.   Document Released: 01/29/2005 Document Revised: 02/19/2014 Document Reviewed: 05/13/2008 [employer] Interactive Patient Education 2016 [employer] Achilles Tendinitis With Rehab Achilles tendinitis is a disorder of the Achilles tendon. The Achilles tendon connects the large calf muscles (Gastrocnemius and Soleus) to  the heel bone (calcaneus). This tendon is sometimes called the heel cord. It is important for pushing-off and standing on your toes and is important for walking, running, or jumping. Tendinitis is often caused by overuse and repetitive microtrauma. SYMPTOMS  Pain, tenderness, swelling, warmth, and redness may occur over the Achilles tendon even at rest.  Pain with pushing off, or flexing or extending the ankle.  Pain that is worsened after or during activity. CAUSES   Overuse sometimes seen with rapid increase in exercise programs or in sports requiring running and jumping.  Poor physical conditioning (strength and flexibility or endurance).  Running sports, especially training running down hills.  Inadequate warm-up before practice or play or failure to stretch before participation.  Injury to the  tendon. PREVENTION   Warm up and stretch before practice or competition.  Allow time for adequate rest and recovery between practices and competition.  Keep up conditioning.  Keep up ankle and leg flexibility.  Improve or keep muscle strength and endurance.  Improve cardiovascular fitness.  Use proper technique.  Use proper equipment (shoes, skates).  To help prevent recurrence, taping, protective strapping, or an adhesive bandage may be recommended for several weeks after healing is complete. PROGNOSIS   Recovery may take weeks to several months to heal.  Longer recovery is expected if symptoms have been prolonged.  Recovery is usually quicker if the inflammation is due to a direct blow as compared with overuse or sudden strain. RELATED COMPLICATIONS   Healing time will be prolonged if the condition is not correctly treated. The injury must be given plenty of time to heal.  Symptoms can reoccur if activity is resumed too soon.  Untreated, tendinitis may increase the risk of tendon rupture requiring additional time for recovery and possibly surgery. TREATMENT   The first treatment consists of rest anti-inflammatory medication, and ice to relieve the pain.  Stretching and strengthening exercises after resolution of pain will likely help reduce the risk of recurrence. Referral to a physical therapist or athletic trainer for further evaluation and treatment may be helpful.  A walking boot or cast may be recommended to rest the Achilles tendon. This can help break the cycle of inflammation and microtrauma.  Arch supports (orthotics) may be prescribed or recommended by your caregiver as an adjunct to therapy and rest.  Surgery to remove the inflamed tendon lining or degenerated tendon tissue is rarely necessary and has shown less than predictable results. MEDICATION   Nonsteroidal anti-inflammatory medications, such as aspirin and ibuprofen, may be used for pain and  inflammation relief. Do not take within 7 days before surgery. Take these as directed by your caregiver. Contact your caregiver immediately if any bleeding, stomach upset, or signs of allergic reaction occur. Other minor pain relievers, such as acetaminophen, may also be used.  Pain relievers may be prescribed as necessary by your caregiver. Do not take prescription pain medication for longer than 4 to 7 days. Use only as directed and only as much as you need.  Cortisone injections are rarely indicated. Cortisone injections may weaken tendons and predispose to rupture. It is better to give the condition more time to heal than to use them. HEAT AND COLD  Cold is used to relieve pain and reduce inflammation for acute and chronic Achilles tendinitis. Cold should be applied for 10 to 15 minutes every 2 to 3 hours for inflammation and pain and immediately after any activity that aggravates your symptoms. Use ice packs or an ice massage.  Heat may be used before performing stretching and strengthening activities prescribed by your caregiver. Use a heat pack or a warm soak. SEEK MEDICAL CARE IF:  Symptoms get worse or do not improve in 2 weeks despite treatment.  New, unexplained symptoms develop. Drugs used in treatment may produce side effects. EXERCISES RANGE OF MOTION (ROM) AND STRETCHING EXERCISES - Achilles Tendinitis  These exercises may help you when beginning to rehabilitate your injury. Your symptoms may resolve with or without further involvement from your physician, physical therapist or athletic trainer. While completing these exercises, remember:   Restoring tissue flexibility helps normal motion to return to the joints. This allows healthier, less painful movement and activity.  An effective stretch should be held for at least 30 seconds.  A stretch should never be painful. You should only feel a gentle lengthening or release in the stretched tissue. STRETCH - Gastroc, Standing    Place hands on wall.  Extend right / left leg, keeping the front knee somewhat bent.  Slightly point your toes inward on your back foot.  Keeping your right / left heel on the floor and your knee straight, shift your weight toward the wall, not allowing your back to arch.  You should feel a gentle stretch in the right / left calf. Hold this position for __________ seconds. Repeat __________ times. Complete this stretch __________ times per day. STRETCH - Soleus, Standing   Place hands on wall.  Extend right / left leg, keeping the other knee somewhat bent.  Slightly point your toes inward on your back foot.  Keep your right / left heel on the floor, bend your back knee, and slightly shift your weight over the back leg so that you feel a gentle stretch deep in your back calf.  Hold this position for __________ seconds. Repeat __________ times. Complete this stretch __________ times per day. STRETCH - Gastrocsoleus, Standing  Note: This exercise can place a lot of stress on your foot and ankle. Please complete this exercise only if specifically instructed by your caregiver.   Place the ball of your right / left foot on a step, keeping your other foot firmly on the same step.  Hold on to the wall or a rail for balance.  Slowly lift your other foot, allowing your body weight to press your heel down over the edge of the step.  You should feel a stretch in your right / left calf.  Hold this position for __________ seconds.  Repeat this exercise with a slight bend in your knee. Repeat __________ times. Complete this stretch __________ times per day.  STRENGTHENING EXERCISES - Achilles Tendinitis These exercises may help you when beginning to rehabilitate your injury. They may resolve your symptoms with or without further involvement from your physician, physical therapist or athletic trainer. While completing these exercises, remember:   Muscles can gain both the endurance and  the strength needed for everyday activities through controlled exercises.  Complete these exercises as instructed by your physician, physical therapist or athletic trainer. Progress the resistance and repetitions only as guided.  You may experience muscle soreness or fatigue, but the pain or discomfort you are trying to eliminate should never worsen during these exercises. If this pain does worsen, stop and make certain you are following the directions exactly. If the pain is still present after adjustments, discontinue the exercise until you can discuss the trouble with your clinician. STRENGTH - Plantar-flexors   Sit with your right / left leg extended.  Holding onto both ends of a rubber exercise band/tubing, loop it around the ball of your foot. Keep a slight tension in the band.  Slowly push your toes away from you, pointing them downward.  Hold this position for __________ seconds. Return slowly, controlling the tension in the band/tubing. Repeat __________ times. Complete this exercise __________ times per day.  STRENGTH - Plantar-flexors   Stand with your feet shoulder width apart. Steady yourself with a wall or table using as little support as needed.  Keeping your weight evenly spread over the width of your feet, rise up on your toes.*  Hold this position for __________ seconds. Repeat __________ times. Complete this exercise __________ times per day.  *If this is too easy, shift your weight toward your right / left leg until you feel challenged. Ultimately, you may be asked to do this exercise with your right / left foot only. STRENGTH - Plantar-flexors, Eccentric  Note: This exercise can place a lot of stress on your foot and ankle. Please complete this exercise only if specifically instructed by your caregiver.   Place the balls of your feet on a step. With your hands, use only enough support from a wall or rail to keep your balance.  Keep your knees straight and rise up on  your toes.  Slowly shift your weight entirely to your right / left toes and pick up your opposite foot. Gently and with controlled movement, lower your weight through your right / left foot so that your heel drops below the level of the step. You will feel a slight stretch in the back of your calf at the end position.  Use the healthy leg to help rise up onto the balls of both feet, then lower weight only on the right / left leg again. Build up to 15 repetitions. Then progress to 3 consecutive sets of 15 repetitions.*  After completing the above exercise, complete the same exercise with a slight knee bend (about 30 degrees). Again, build up to 15 repetitions. Then progress to 3 consecutive sets of 15 repetitions.* Perform this exercise __________ times per day.  *When you easily complete 3 sets of 15, your physician, physical therapist or athletic trainer may advise you to add resistance by wearing a backpack filled with additional weight. STRENGTH - Plantar Flexors, Seated   Sit on a chair that allows your feet to rest flat on the ground. If necessary, sit at the edge of the chair.  Keeping your toes firmly on the ground, lift your right / left heel as far as you can without increasing any discomfort in your ankle. Repeat __________ times. Complete this exercise __________ times a day. *If instructed by your physician, physical therapist or athletic trainer, you may add ____________________ of resistance by placing a weighted object on your right / left knee.   This information is not intended to replace advice given to you by your health care provider. Make sure you discuss any questions you have with your health care provider.   Document Released: 08/30/2004 Document Revised: 02/19/2014 Document Reviewed: 05/13/2008 [employer] Interactive Patient Education [employer]

## 2015-10-19 ENCOUNTER — Other Ambulatory Visit: Payer: Self-pay

## 2015-10-19 MED ORDER — NONFORMULARY OR COMPOUNDED ITEM: 180.0000 g | Freq: Four times a day (QID) | 3 refills | Status: DC

## 2016-01-02 ENCOUNTER — Other Ambulatory Visit: Payer: Self-pay | Admitting: General Surgery

## 2016-02-03 ENCOUNTER — Encounter (HOSPITAL_BASED_OUTPATIENT_CLINIC_OR_DEPARTMENT_OTHER): Payer: Self-pay | Admitting: *Deleted

## 2016-02-03 NOTE — Progress Notes (Signed)
NPO AFTER MN.  ARRIVE AT [id number].  NEEDS ISTAT 8 AND EKG.  WILL TAKE ATENOLOL AM DOS W/ SIPS OF WATER.

## 2016-02-10 ENCOUNTER — Ambulatory Visit (HOSPITAL_BASED_OUTPATIENT_CLINIC_OR_DEPARTMENT_OTHER)
Admission: RE | Admit: 2016-02-10 | Discharge: 2016-02-10 | Disposition: A | Discharge status: Home / Self Care | Payer: BLUE CROSS/BLUE SHIELD | Source: Ambulatory Visit | Attending: General Surgery | Admitting: General Surgery

## 2016-02-10 ENCOUNTER — Ambulatory Visit (HOSPITAL_BASED_OUTPATIENT_CLINIC_OR_DEPARTMENT_OTHER): Payer: BLUE CROSS/BLUE SHIELD | Admitting: Anesthesiology

## 2016-02-10 ENCOUNTER — Encounter (HOSPITAL_BASED_OUTPATIENT_CLINIC_OR_DEPARTMENT_OTHER): Payer: Self-pay | Admitting: *Deleted

## 2016-02-10 ENCOUNTER — Encounter (HOSPITAL_BASED_OUTPATIENT_CLINIC_OR_DEPARTMENT_OTHER)
Admission: RE | Disposition: A | Discharge status: Home / Self Care | Payer: Self-pay | Source: Ambulatory Visit | Attending: General Surgery

## 2016-02-10 ENCOUNTER — Other Ambulatory Visit: Payer: Self-pay

## 2016-02-10 DIAGNOSIS — L720 Epidermal cyst: Secondary | ICD-10-CM | POA: Insufficient documentation

## 2016-02-10 DIAGNOSIS — I10 Essential (primary) hypertension: Secondary | ICD-10-CM | POA: Insufficient documentation

## 2016-02-10 DIAGNOSIS — D869 Sarcoidosis, unspecified: Secondary | ICD-10-CM | POA: Insufficient documentation

## 2016-02-10 DIAGNOSIS — Z79899 Other long term (current) drug therapy: Secondary | ICD-10-CM | POA: Insufficient documentation

## 2016-02-10 DIAGNOSIS — K610 Anal abscess: Secondary | ICD-10-CM | POA: Diagnosis not present

## 2016-02-10 HISTORY — DX: Sarcoidosis of lung: D86.0

## 2016-02-10 HISTORY — PX: MASS EXCISION: SHX2000

## 2016-02-10 HISTORY — DX: Sarcoidosis of skin: D86.3

## 2016-02-10 HISTORY — DX: Alcohol dependence, uncomplicated: F10.20

## 2016-02-10 HISTORY — PX: PLACEMENT OF SETON: SHX6029

## 2016-02-10 HISTORY — DX: Presence of external hearing-aid: Z97.4

## 2016-02-10 HISTORY — PX: EVALUATION UNDER ANESTHESIA WITH FISTULECTOMY: SHX5623

## 2016-02-10 LAB — POCT I-STAT, CHEM 8
BUN: 16 mg/dL (ref 6–20)
CALCIUM ION: 1.19 mmol/L (ref 1.15–1.40)
CHLORIDE: 105 mmol/L (ref 101–111)
CREATININE: 0.8 mg/dL (ref 0.44–1.00)
GLUCOSE: 90 mg/dL (ref 65–99)
HCT: 42 % (ref 36.0–46.0)
Hemoglobin: 14.3 g/dL (ref 12.0–15.0)
Potassium: 4 mmol/L (ref 3.5–5.1)
SODIUM: 147 mmol/L — AB (ref 135–145)
TCO2: 28 mmol/L (ref 0–100)

## 2016-02-10 SURGERY — EXAM UNDER ANESTHESIA WITH FISTULECTOMY
Anesthesia: Monitor Anesthesia Care | Site: Rectum | Laterality: Right

## 2016-02-10 MED ORDER — KETOROLAC TROMETHAMINE 30 MG/ML IJ SOLN
30.0000 mg | Freq: Once | INTRAMUSCULAR | Status: DC | PRN
  Filled 2016-02-10: qty 1

## 2016-02-10 MED ORDER — PROMETHAZINE HCL 25 MG/ML IJ SOLN
6.2500 mg | INTRAMUSCULAR | Status: DC | PRN
  Filled 2016-02-10: qty 1

## 2016-02-10 MED ORDER — BUPIVACAINE-EPINEPHRINE 0.5% -1:200000 IJ SOLN
INTRAMUSCULAR | Status: DC | PRN
  Administered 2016-02-10: 4 mL
  Administered 2016-02-10: 50 mL

## 2016-02-10 MED ORDER — OXYCODONE HCL 5 MG/5ML PO SOLN
5.0000 mg | Freq: Once | ORAL | Status: DC | PRN
  Filled 2016-02-10: qty 5

## 2016-02-10 MED ORDER — MIDAZOLAM HCL 5 MG/5ML IJ SOLN
INTRAMUSCULAR | Status: DC | PRN
  Administered 2016-02-10 (×2): 1 mg via INTRAVENOUS

## 2016-02-10 MED ORDER — LIDOCAINE HCL (CARDIAC) 20 MG/ML IV SOLN
INTRAVENOUS | Status: DC | PRN
  Administered 2016-02-10: 50 mg via INTRAVENOUS

## 2016-02-10 MED ORDER — PROPOFOL 500 MG/50ML IV EMUL
INTRAVENOUS | Status: AC
  Filled 2016-02-10: qty 50

## 2016-02-10 MED ORDER — FENTANYL CITRATE (PF) 100 MCG/2ML IJ SOLN
INTRAMUSCULAR | Status: AC
  Filled 2016-02-10: qty 2

## 2016-02-10 MED ORDER — OXYCODONE HCL 5 MG PO TABS: 5.0000 mg | ORAL_TABLET | Freq: Four times a day (QID) | ORAL | 0 refills | Status: DC | PRN

## 2016-02-10 MED ORDER — MIDAZOLAM HCL 2 MG/2ML IJ SOLN
INTRAMUSCULAR | Status: AC
  Filled 2016-02-10: qty 2

## 2016-02-10 MED ORDER — ONDANSETRON HCL 4 MG/2ML IJ SOLN
INTRAMUSCULAR | Status: DC | PRN
  Administered 2016-02-10: 4 mg via INTRAVENOUS

## 2016-02-10 MED ORDER — FENTANYL CITRATE (PF) 100 MCG/2ML IJ SOLN
INTRAMUSCULAR | Status: DC | PRN
  Administered 2016-02-10: 15 ug via INTRAVENOUS
  Administered 2016-02-10: 10 ug via INTRAVENOUS
  Administered 2016-02-10: 25 ug via INTRAVENOUS

## 2016-02-10 MED ORDER — FENTANYL CITRATE (PF) 100 MCG/2ML IJ SOLN
25.0000 ug | INTRAMUSCULAR | Status: DC | PRN
  Filled 2016-02-10: qty 1

## 2016-02-10 MED ORDER — OXYCODONE HCL 5 MG PO TABS
5.0000 mg | ORAL_TABLET | Freq: Once | ORAL | Status: DC | PRN
  Filled 2016-02-10: qty 1

## 2016-02-10 MED ORDER — LIDOCAINE 2% (20 MG/ML) 5 ML SYRINGE
INTRAMUSCULAR | Status: AC
  Filled 2016-02-10: qty 5

## 2016-02-10 MED ORDER — ONDANSETRON HCL 4 MG/2ML IJ SOLN
INTRAMUSCULAR | Status: AC
  Filled 2016-02-10: qty 2

## 2016-02-10 MED ORDER — PROPOFOL 500 MG/50ML IV EMUL
INTRAVENOUS | Status: DC | PRN
  Administered 2016-02-10: 100 ug/kg/min via INTRAVENOUS

## 2016-02-10 MED ORDER — LACTATED RINGERS IV SOLN
INTRAVENOUS | Status: DC
  Administered 2016-02-10: 08:00:00 via INTRAVENOUS
  Filled 2016-02-10: qty 1000

## 2016-02-10 MED ORDER — LIDOCAINE 5 % EX OINT
TOPICAL_OINTMENT | CUTANEOUS | Status: DC | PRN
  Administered 2016-02-10: 1

## 2016-02-10 SURGICAL SUPPLY — 66 items
BENZOIN TINCTURE PRP APPL 2/3 (GAUZE/BANDAGES/DRESSINGS) ×6 IMPLANT
BLADE CLIPPER SURG (BLADE) IMPLANT
BLADE HEX COATED 2.75 (ELECTRODE) ×3 IMPLANT
BLADE SURG 10 STRL SS (BLADE) ×3 IMPLANT
BLADE SURG 15 STRL LF DISP TIS (BLADE) ×2 IMPLANT
BLADE SURG 15 STRL SS (BLADE) ×1
BRIEF STRETCH FOR OB PAD LRG (UNDERPADS AND DIAPERS) ×6 IMPLANT
CANISTER SUCTION 2500CC (MISCELLANEOUS) ×3 IMPLANT
CHLORAPREP W/TINT 26ML (MISCELLANEOUS) ×3 IMPLANT
COVER BACK TABLE 60X90IN (DRAPES) ×3 IMPLANT
COVER MAYO STAND STRL (DRAPES) ×3 IMPLANT
DECANTER SPIKE VIAL GLASS SM (MISCELLANEOUS) ×3 IMPLANT
DERMABOND ADVANCED (GAUZE/BANDAGES/DRESSINGS) ×1
DERMABOND ADVANCED .7 DNX12 (GAUZE/BANDAGES/DRESSINGS) ×2 IMPLANT
DRAPE LAPAROTOMY 100X72 PEDS (DRAPES) ×6 IMPLANT
DRAPE LG THREE QUARTER DISP (DRAPES) IMPLANT
DRAPE UTILITY XL STRL (DRAPES) ×6 IMPLANT
DRSG PAD ABDOMINAL 8X10 ST (GAUZE/BANDAGES/DRESSINGS) ×3 IMPLANT
DRSG TEGADERM 4X4.75 (GAUZE/BANDAGES/DRESSINGS) IMPLANT
ELECT REM PT RETURN 9FT ADLT (ELECTROSURGICAL) ×3
ELECTRODE REM PT RTRN 9FT ADLT (ELECTROSURGICAL) ×2 IMPLANT
ETHIBOND 2 0 GREEN CT 2 30IN (SUTURE) ×3 IMPLANT
GAUZE VASELINE 3X9 (GAUZE/BANDAGES/DRESSINGS) IMPLANT
GLOVE BIO SURGEON STRL SZ 6.5 (GLOVE) ×9 IMPLANT
GLOVE INDICATOR 7.0 STRL GRN (GLOVE) ×9 IMPLANT
GOWN SPEC L3 XXLG W/TWL (GOWN DISPOSABLE) ×3 IMPLANT
GOWN STRL REUS W/ TWL LRG LVL3 (GOWN DISPOSABLE) ×2 IMPLANT
GOWN STRL REUS W/TWL 2XL LVL3 (GOWN DISPOSABLE) ×3 IMPLANT
GOWN STRL REUS W/TWL LRG LVL3 (GOWN DISPOSABLE) ×1
KIT ROOM TURNOVER WOR (KITS) ×3 IMPLANT
LOOP VESSEL MAXI BLUE (MISCELLANEOUS) ×6 IMPLANT
NEEDLE HYPO 22GX1.5 SAFETY (NEEDLE) ×3 IMPLANT
NS IRRIG 500ML POUR BTL (IV SOLUTION) ×3 IMPLANT
PACK BASIN DAY SURGERY FS (CUSTOM PROCEDURE TRAY) ×3 IMPLANT
PAD ABD 8X10 STRL (GAUZE/BANDAGES/DRESSINGS) ×3 IMPLANT
PENCIL BUTTON HOLSTER BLD 10FT (ELECTRODE) ×3 IMPLANT
SPONGE GAUZE 4X4 12PLY STER LF (GAUZE/BANDAGES/DRESSINGS) ×3 IMPLANT
STRIP CLOSURE SKIN 1/2X4 (GAUZE/BANDAGES/DRESSINGS) IMPLANT
SUCTION FRAZIER HANDLE 10FR (MISCELLANEOUS) ×1
SUCTION TUBE FRAZIER 10FR DISP (MISCELLANEOUS) ×2 IMPLANT
SUT CHROMIC 2 0 SH (SUTURE) ×3 IMPLANT
SUT CHROMIC 3 0 SH 27 (SUTURE) IMPLANT
SUT ETHIBOND 0 (SUTURE) IMPLANT
SUT ETHILON 2 0 FS 18 (SUTURE) IMPLANT
SUT ETHILON 4 0 PS 2 18 (SUTURE) IMPLANT
SUT SILK 2 0 (SUTURE)
SUT SILK 2 0 SH (SUTURE) IMPLANT
SUT SILK 2-0 18XBRD TIE 12 (SUTURE) IMPLANT
SUT VIC AB 2-0 SH 27 (SUTURE)
SUT VIC AB 2-0 SH 27XBRD (SUTURE) IMPLANT
SUT VIC AB 3-0 SH 18 (SUTURE) IMPLANT
SUT VIC AB 3-0 SH 27 (SUTURE) ×1
SUT VIC AB 3-0 SH 27X BRD (SUTURE) ×2 IMPLANT
SUT VIC AB 4-0 P-3 18XBRD (SUTURE) IMPLANT
SUT VIC AB 4-0 P3 18 (SUTURE)
SUT VIC AB 4-0 SH 18 (SUTURE) IMPLANT
SUT VICRYL 4-0 PS2 18IN ABS (SUTURE) ×3 IMPLANT
SYR BULB IRRIGATION 50ML (SYRINGE) ×3 IMPLANT
SYR CONTROL 10ML LL (SYRINGE) ×3 IMPLANT
TOWEL OR 17X24 6PK STRL BLUE (TOWEL DISPOSABLE) ×6 IMPLANT
TRAY DSU PREP LF (CUSTOM PROCEDURE TRAY) ×3 IMPLANT
TUBE ANAEROBIC SPECIMEN COL (MISCELLANEOUS) IMPLANT
TUBE CONNECTING 12X1/4 (SUCTIONS) ×3 IMPLANT
UNDERPAD 30X30 INCONTINENT (UNDERPADS AND DIAPERS) IMPLANT
WATER STERILE IRR 500ML POUR (IV SOLUTION) ×3 IMPLANT
YANKAUER SUCT BULB TIP NO VENT (SUCTIONS) ×3 IMPLANT

## 2016-02-10 NOTE — Op Note (Signed)
02/10/2016  9:08 AM  PATIENT:  Miranda Dominguez  55 y.o. female  Patient Care Team: Darrow Bussingibas Koirala, MD as PCP - General (Family Medicine) Nyoka CowdenMichael B Wert, MD as Consulting Physician (Pulmonary Disease) Rachael FeeIrving A Lugo, MD as Consulting Physician (Psychiatry) Romie LeveeAlicia Levie Owensby, MD as Consulting Physician (Colon and Rectal Surgery)  PRE-OPERATIVE DIAGNOSIS:  recurrent anal abscesses, R neck cyst  POST-OPERATIVE DIAGNOSIS:  recurrent anal abscesses, R neck cyst  PROCEDURE:   ANAL EXAM UNDER ANESTHESIA PLACEMENT OF SETON EXCISIONAL BIOPSY RIGHT LATERAL NECK MASS   Surgeon(s): Romie LeveeAlicia Shammara Jarrett, MD  ASSISTANT: none   ANESTHESIA:   local and MAC  SPECIMEN:  Source of Specimen:  R lateral neck mass (sebaceous cyst)  DISPOSITION OF SPECIMEN:  PATHOLOGY  COUNTS:  YES  PLAN OF CARE: Discharge to home after PACU  PATIENT DISPOSITION:  PACU - hemodynamically stable.  INDICATION: 55 y.o. F with recurrent anal abscesses and growing R lateral neck subcutaneous mass   OR FINDINGS: R lateral neck mass consistent with sebaceous cyst  DESCRIPTION: the patient was identified in the preoperative holding area and taken to the OR where they were laid on the operating room table.  MAC anesthesia was induced without difficulty.  The patient Was positioned laterally with left side down. The right neck was prepped and draped in usual sterile fashion. A surgical timeout was performed. A field block was performed using half percent Marcaine with epi.  I made an incision around the mass in an elliptical fashion vertically. I dissected around the mass with a 15 blade scalpel. The mass was removed with the complete cyst wall. Hemostasis was achieved using direct pressure and a artery. The subcutaneous tissue was reapproximated using 3-0 Vicryl sutures. The skin was closed with a running 4-0 Vicryl subcuticular suture. The mass was approximately 7-8 mm in size.  Dermabond was placed over the incision.  The patient  was then positioned in prone jackknife position with buttocks gently taped apart.  The patient was then prepped and draped in usual sterile fashion.  SCDs were noted to be in place prior to the initiation of anesthesia. A surgical timeout was performed indicating the correct patient, procedure, positioning and need for preoperative antibiotics.  A rectal block was performed using Marcaine with epinephrine.    I began with a digital rectal exam.  There were no masses noted.  I then placed a Hill-Ferguson anoscope into the anal canal and evaluated this completely.  Patient had no hemorrhoid disease.There were no internal lesions visualized. I made a small incision in the previous scar in the right anterior buttock.  I then used an S shaped fistula probe to traverse the fistula tract. This exited into the anal canal in a radial fashion just above the dentate line. This appeared to be involving her entire external sphincter. I decided to place a seton. This was done by pulling a vessel loop through the fistula tract using an Ethibond suture. The Ethibond suture was then used to create a loop and secured a seton in place. Additional local anesthesia was placed around the fistula tract. A dressing was placed over the surgical area and the patient was awakened from anesthesia and sent to the postanesthesia care unit in stable condition. All counts were correct per operating room staff.

## 2016-02-10 NOTE — Anesthesia Preprocedure Evaluation (Signed)
Anesthesia Evaluation  Patient identified by MRN, date of birth, ID band Patient awake    Reviewed: Allergy & Precautions, NPO status , Patient's Chart, lab work & pertinent test results  Airway Mallampati: II  TM Distance: >3 FB Neck ROM: Full    Dental no notable dental hx.    Pulmonary neg pulmonary ROS,    Pulmonary exam normal breath sounds clear to auscultation       Cardiovascular hypertension, Normal cardiovascular exam Rhythm:Regular Rate:Normal     Neuro/Psych negative neurological ROS  negative psych ROS   GI/Hepatic negative GI ROS, (+)     substance abuse  alcohol use,   Endo/Other  negative endocrine ROS  Renal/GU negative Renal ROS  negative genitourinary   Musculoskeletal negative musculoskeletal ROS (+)   Abdominal   Peds negative pediatric ROS (+)  Hematology negative hematology ROS (+)   Anesthesia Other Findings   Reproductive/Obstetrics negative OB ROS                             Anesthesia Physical Anesthesia Plan  ASA: III  Anesthesia Plan: MAC   Post-op Pain Management:    Induction: Intravenous  Airway Management Planned: Simple Face Mask  Additional Equipment:   Intra-op Plan:   Post-operative Plan:   Informed Consent: I have reviewed the patients History and Physical, chart, labs and discussed the procedure including the risks, benefits and alternatives for the proposed anesthesia with the patient or authorized representative who has indicated his/her understanding and acceptance.   Dental advisory given  Plan Discussed with: CRNA and Surgeon  Anesthesia Plan Comments:         Anesthesia Quick Evaluation

## 2016-02-10 NOTE — Anesthesia Postprocedure Evaluation (Signed)
Anesthesia Post Note  Patient: Miranda ModyLisa J Dominguez  Procedure(s) Performed: Procedure(s) (LRB): ANAL EXAM UNDER ANESTHESIA (N/A) PLACEMENT OF SETON (N/A) EXCISIONAL BIOPSY RIGHT NECK MASS (Right)  Patient location during evaluation: PACU Anesthesia Type: MAC Level of consciousness: awake and alert Pain management: pain level controlled Vital Signs Assessment: post-procedure vital signs reviewed and stable Respiratory status: spontaneous breathing, nonlabored ventilation, respiratory function stable and patient connected to nasal cannula oxygen Cardiovascular status: stable and blood pressure returned to baseline Anesthetic complications: no       Last Vitals:  Vitals:   02/10/16 0628 02/10/16 0913  BP: 128/81   Pulse: 88   Resp: 16 18  Temp: 36.7 C 36.4 C    Last Pain:  Vitals:   02/10/16 0628  TempSrc: Oral                 Mica Ramdass S

## 2016-02-10 NOTE — Transfer of Care (Signed)
Last Vitals:  Vitals:   02/10/16 0628  BP: 128/81  Pulse: 88  Resp: 16  Temp: 36.7 C    Last Pain:  Vitals:   02/10/16 0628  TempSrc: Oral      Patients Stated Pain Goal: 7 (02/10/16 0719)  Immediate Anesthesia Transfer of Care Note  Patient: [name]  Procedure(s) Performed: Procedure(s) (LRB): ANAL EXAM UNDER ANESTHESIA (N/A) PLACEMENT OF SETON (N/A) EXCISIONAL BIOPSY RIGHT NECK MASS (Right)  Patient Location: PACU  Anesthesia Type: MAC  Level of Consciousness: awake, alert  and oriented  Airway & Oxygen Therapy: Patient Spontanous Breathing and Patient connected to face mask oxygen  Post-op Assessment: Report given to PACU RN and Post -op Vital signs reviewed and stable  Post vital signs: Reviewed and stable  Complications: No apparent anesthesia complications

## 2016-02-10 NOTE — Progress Notes (Signed)
Anal exam poss fistulotomy/seton placement

## 2016-02-10 NOTE — H&P (Signed)
HPI: The patient is a 55 year old female who presents with a perirectal abscess. Miranda MormonLisa Dominguez is a 55 year old patient who has a history of 3 previous perirectal abscesses. She had had a previous episode about 3 years ago, 1 yr ago and 6 months ago. She underwent an exam under anesthesia previously and [place] in 2014 and a partial fistulotomy was performed with seton around the sphincter involved portion of the fistula. I assume that this seton was removed in the office. She was seen in the office a months ago and ultimately underwent another I and D. She presents today for surgery. She denies any fevers. [place]  Past Medical History:  Diagnosis Date  . Alcohol dependence (HCC)    has been in detox several times  . Anal abscess 2014, 2017   RECURRENT  ---I&D [place] 2014, I&D in [place] Jan 2017  . Bilateral sensorineural hearing loss 07/08/2015  . Hypertension   . Sarcoidosis of skin (HCC)    dx  2008  via bx ,  granulomatous dermatitis  . Sarcoidosis, lung (HCC) as of 02-03-2016  per pt asymptomatic   per office note dx  2008 by  dr wert (pulmologist) per ct,  pt with no signs or symptoms at that time  . Subjective tinnitus 07/08/2015  . Wears hearing aid    bilateral -- pt states only wears at work   Past Surgical History:  Procedure Laterality Date  . INCISION AND DRAINAGE ABSCESS ANAL  02/2015  . PLACEMENT OF SETON  08/07/2012   anal abscess  . ROBOTIC ASSISTED TOTAL HYSTERECTOMY  2009   w/  Bilateral Salpingoophorectomy   Social History   Social History  . Marital status: Single    Spouse name: N/A  . Number of children: N/A  . Years of education: N/A   Occupational History  . Not on file.   Social History Main Topics  . Smoking status: Never Smoker  . Smokeless tobacco: Never Used  . Alcohol use Yes     Comment: hx alcohol dependence -- PER PT 4 WINE WEEKLY  . Drug use: No  . Sexual activity: Not on file   Other Topics Concern   . Not on file   Social History Narrative  . No narrative on file   History reviewed. No pertinent family history. Review of Systems - General ROS: negative for - chills or fever Respiratory ROS: no cough, shortness of breath, or wheezing Cardiovascular ROS: no chest pain or dyspnea on exertion Gastrointestinal ROS: no abdominal pain, change in bowel habits, or black or bloody stools Genito-Urinary ROS: no dysuria, trouble voiding, or hematuria Musculoskeletal ROS: negative for - joint pain or muscular weakness  Current Meds  Medication Sig  . atenolol (TENORMIN) 50 MG tablet Take 50 mg by mouth every morning.   . diclofenac sodium (VOLTAREN) 1 % GEL Apply 4 g topically 4 (four) times daily.  . hydrochlorothiazide (HYDRODIURIL) 25 MG tablet Take 25 mg by mouth every morning.     No Known Allergies  Physical Exam  Constitutional: She is well-developed, well-nourished, and in no distress.  HENT:  Head: Normocephalic and atraumatic.  Eyes: Conjunctivae and EOM are normal. Pupils are equal, round, and reactive to light.  Neck: Normal range of motion. Neck supple.  Mass 1 cm right neck, sub-q  Cardiovascular: Normal rate and regular rhythm.   Pulmonary/Chest: Effort normal and breath sounds normal.  Abdominal: Soft. She exhibits no distension. There is no tenderness.  Musculoskeletal: Normal range of motion.    Assessment and Plan Anal fistula R anterior perianal region.  Will plan on locating this and performing fistulotomy vs seton placement.  Risks include bleeding, pain and a small chance of incontinence.    R lateral neck mass.  Will plan on surgical excision.  Risks include bleeding, infection and a small chance of recurrence.  I believe she understands this and has agreed to proceed with surgery.

## 2016-02-10 NOTE — Anesthesia Procedure Notes (Signed)
Procedure Name: MAC Date/Time: 02/10/2016 8:34 AM Performed by: Eilene GhaziOSE, GEORGE Pre-anesthesia Checklist: Patient identified, Emergency Drugs available, Suction available, Patient being monitored and Timeout performed Oxygen Delivery Method: Simple face mask Placement Confirmation: positive ETCO2,  CO2 detector and breath sounds checked- equal and bilateral

## 2016-02-10 NOTE — Discharge Instructions (Addendum)
ANORECTAL SURGERY: POST OP INSTRUCTIONS °1. Take your usually prescribed home medications unless otherwise directed. °2. DIET: During the first few hours after surgery sip on some liquids until you are able to urinate.  It is normal to not urinate for several hours after this surgery.  If you feel uncomfortable, please contact the office for instructions.  After you are able to urinate,you may eat, if you feel like it.  Follow a light bland diet the first 24 hours after arrival home, such as soup, liquids, crackers, etc.  Be sure to include lots of fluids daily (6-8 glasses).  Avoid fast food or heavy meals, as your are more likely to get nauseated.  Eat a low fat diet the next few days after surgery.  Limit caffeine intake to 1-2 servings a day. °3. PAIN CONTROL: °a. Pain is best controlled by a usual combination of several different methods TOGETHER: °i. Muscle relaxation: Soak in a warm bath (or Sitz bath) three times a day and after bowel movements.  Continue to do this until all pain is resolved. °ii. Over the counter pain medication °iii. Prescription pain medication °b. Most patients will experience some swelling and discomfort in the anus/rectal area and incisions.  Heat such as warm towels, sitz baths, warm baths, etc to help relax tight/sore spots and speed recovery.  Some people prefer to use ice, especially in the first couple days after surgery, as it may decrease the pain and swelling, or alternate between ice & heat.  Experiment to what works for you.  Swelling and bruising can take several weeks to resolve.  Pain can take even longer to completely resolve. °c. It is helpful to take an over-the-counter pain medication regularly for the first few weeks.  Choose one of the following that works best for you: °i. Naproxen (Aleve, etc)  Two 220mg tabs twice a day °ii. Ibuprofen (Advil, etc) Three 200mg tabs four times a day (every meal & bedtime) °d. A  prescription for pain medication (such as percocet,  oxycodone, hydrocodone, etc) should be given to you upon discharge.  Take your pain medication as prescribed.  °i. If you are having problems/concerns with the prescription medicine (does not control pain, nausea, vomiting, rash, itching, etc), please call us [phone] to see if we need to switch you to a different pain medicine that will work better for you and/or control your side effect better. °ii. If you need a refill on your pain medication, please contact your pharmacy.  They will contact our office to request authorization. Prescriptions will not be filled after 5 pm or on week-ends. °4. KEEP YOUR BOWELS REGULAR and AVOID CONSTIPATION °a. The goal is one to two soft bowel movements a day.  You should at least have a bowel movement every other day. °b. Avoid getting constipated.  Between the surgery and the pain medications, it is common to experience some constipation. This can be very painful after rectal surgery.  Increasing fluid intake and taking a fiber supplement (such as Metamucil, Citrucel, FiberCon, etc) 1-2 times a day regularly will usually help prevent this problem from occurring.  A stool softener like colace is also recommended.  This can be purchased over the counter at your pharmacy.  You can take it up to 3 times a day.  If you do not have a bowel movement after 24 hrs since your surgery, take one does of milk of magnesia.  If you still haven't had a bowel movement 8-12 hours after   that dose, take another dose.  If you don't have a bowel movement 48 hrs after surgery, purchase a Fleets enema from the drug store and administer gently per package instructions.  If you still are having trouble with your bowel movements after that, please call the office for further instructions. °c. If you develop diarrhea or have many loose bowel movements, simplify your diet to bland foods & liquids for a few days.  Stop any stool softeners and decrease your fiber supplement.  Switching to mild  anti-diarrheal medications (Kayopectate, Pepto Bismol) can help.  If this worsens or does not improve, please call us. ° °5. Wound Care °a. Remove your bandages before your first bowel movement or 8 hours after surgery.     °b. Remove any wound packing material at this tim,e as well.  You do not need to repack the wound unless instructed otherwise.  Wear an absorbent pad or soft cotton gauze in your underwear to catch any drainage and help keep the area clean. You should change this every 2-3 hours while awake. °c. Keep the area clean and dry.  Bathe / shower every day, especially after bowel movements.  Keep the area clean by showering / bathing over the incision / wound.   It is okay to soak an open wound to help wash it.  Wet wipes or showers / gentle washing after bowel movements is often less traumatic than regular toilet paper. °d. You may have some styrofoam-like soft packing in the rectum which will come out with the first bowel movement.  °e. You will often notice bleeding with bowel movements.  This should slow down by the end of the first week of surgery °f. Expect some drainage.  This should slow down, too, by the end of the first week of surgery.  Wear an absorbent pad or soft cotton gauze in your underwear until the drainage stops. °g. Do Not sit on a rubber or pillow ring.  This can make you symptoms worse.  You may sit on a soft pillow if needed.  °6. ACTIVITIES as tolerated:   °a. You may resume regular (light) daily activities beginning the next day--such as daily self-care, walking, climbing stairs--gradually increasing activities as tolerated.  If you can walk 30 minutes without difficulty, it is safe to try more intense activity such as jogging, treadmill, bicycling, low-impact aerobics, swimming, etc. °b. Save the most intensive and strenuous activity for last such as sit-ups, heavy lifting, contact sports, etc  Refrain from any heavy lifting or straining until you are off narcotics for pain  control.   °c. You may drive when you are no longer taking prescription pain medication, you can comfortably sit for long periods of time, and you can safely maneuver your car and apply brakes. °d. You may have sexual intercourse when it is comfortable.  °7. FOLLOW UP in our office °a. Please call [facility] at [phone] to set up an appointment to see your surgeon in the office for a follow-up appointment approximately 3-4 weeks after your surgery. °b. Make sure that you call for this appointment the day you arrive home to insure a convenient appointment time. °10. IF YOU HAVE DISABILITY OR FAMILY LEAVE FORMS, BRING THEM TO THE OFFICE FOR PROCESSING.  DO NOT GIVE THEM TO YOUR DOCTOR. ° ° ° ° °WHEN TO CALL US [phone]: °1. Poor pain control °2. Reactions / problems with new medications (rash/itching, nausea, etc)  °3. Fever over 101.5 F (38.5 C) °4.   Inability to urinate 5. Nausea and/or vomiting 6. Worsening swelling or bruising 7. Continued bleeding from incision. 8. Increased pain, redness, or drainage from the incision  The clinic staff is available to answer your questions during regular business hours (8:30am-5pm).  Please dont hesitate to call and ask to speak to one of our nurses for clinical concerns.   A surgeon from [facility] is always on call at the hospitals   If you have a medical emergency, go to the nearest emergency room or call 911.    [facility] [address], [address], [place], [place]  [address] ? MAIN: [phone] ? TOLL FREE: [phone] ? FAX [phone] [email or web address]   Post Anesthesia Home Care Instructions  Activity: Get plenty of rest for the remainder of the day. A responsible adult should stay with you for 24 hours following the procedure.  For the next 24 hours, DO NOT: -Drive a car -Advertising copywriterperate machinery -Drink alcoholic beverages -Take any medication unless instructed by your physician -Make  any legal decisions or sign important papers.  Meals: Start with liquid foods such as gelatin or soup. Progress to regular foods as tolerated. Avoid greasy, spicy, heavy foods. If nausea and/or vomiting occur, drink only clear liquids until the nausea and/or vomiting subsides. Call your physician if vomiting continues.  Special Instructions/Symptoms: Your throat may feel dry or sore from the anesthesia or the breathing tube placed in your throat during surgery. If this causes discomfort, gargle with warm salt water. The discomfort should disappear within 24 hours.  If you had a scopolamine patch placed behind your ear for the management of post- operative nausea and/or vomiting:  1. The medication in the patch is effective for 72 hours, after which it should be removed.  Wrap patch in a tissue and discard in the trash. Wash hands thoroughly with soap and water. 2. You may remove the patch earlier than 72 hours if you experience unpleasant side effects which may include dry mouth, dizziness or visual disturbances. 3. Avoid touching the patch. Wash your hands with soap and water after contact with the patch.

## 2016-02-10 NOTE — Progress Notes (Signed)
EXCISION NECK MASS

## 2016-02-14 ENCOUNTER — Encounter (HOSPITAL_BASED_OUTPATIENT_CLINIC_OR_DEPARTMENT_OTHER): Payer: Self-pay | Admitting: General Surgery

## 2016-04-19 ENCOUNTER — Other Ambulatory Visit: Payer: Self-pay | Admitting: General Surgery

## 2016-04-19 NOTE — H&P (Signed)
History of Present Illness Miranda Levee MD; 04/19/2016 2:59 PM) The patient is a 56 year old female who presents with a perirectal abscess. Miranda Dominguez is a 56 year old patient who has a history of 3 previous perirectal abscesses. She underwent an exam under anesthesia previously in [place] in 2014 and a partial fistulotomy was performed with seton around the sphincter involved portion of the fistula. I assume that this seton was removed in the office. Since that time she has had further recurrences of her abscess. I recommended exam under anesthesia. Her fistula was noted to proceed through the entire external muscle complex. I placed a seton in late Dec 2017. She has healed well and has no complaints.    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 Medications Reconciled Atenolol (50MG  Tablet, Oral daily) Active.  Social History Miranda Levee, MD; 04/19/2016 3:00 PM) Alcohol use Moderate alcohol use. Caffeine use Carbonated beverages. No drug use Tobacco use Former smoker.  Family History Miranda Levee, MD; 04/19/2016 3:00 PM) Hypertension Mother. Malignant Neoplasm Of Pancreas Mother.  Pregnancy / Birth History Miranda Levee, MD; 04/19/2016 3:00 PM) Age at menarche 13 years. Age of menopause 32-50  Other Problems Miranda Levee, MD; 04/19/2016 3:00 PM) High blood  pressure     Review of Systems Miranda Levee MD; 04/19/2016 2:59 PM) General Not Present- Appetite Loss, Chills, Fatigue, Fever, Night Sweats, Weight Gain and Weight Loss. Skin Not Present- Change in Wart/Mole, Dryness, Hives, Jaundice, New Lesions, Non-Healing Wounds, Rash and Ulcer. HEENT Present- Wears glasses/contact lenses. Not Present- Earache, Hearing Loss, Hoarseness, Nose Bleed, Oral Ulcers, Ringing in the Ears, Seasonal Allergies, Sinus Pain, Sore Throat, Visual Disturbances and Yellow Eyes. Respiratory Not Present- Bloody sputum, Chronic Cough, Difficulty Breathing, Snoring and Wheezing. Breast Not Present- Breast Mass, Breast Pain, Nipple Discharge and Skin Changes. Cardiovascular Not Present- Chest Pain, Difficulty Breathing Lying Down, Leg Cramps, Palpitations, Rapid Heart Rate, Shortness of Breath and Swelling of Extremities. Gastrointestinal Present- Rectal Pain. Not Present- Abdominal Pain, Bloating, Bloody Stool, Change in Bowel Habits, Chronic diarrhea, Constipation, Difficulty Swallowing, Excessive gas, Gets full quickly at meals, Hemorrhoids, Indigestion, Nausea and Vomiting. Female Genitourinary Not Present- Frequency, Nocturia, Painful Urination, Pelvic Pain and Urgency. Musculoskeletal Not Present- Back Pain, Joint Pain, Joint Stiffness, Muscle Pain, Muscle Weakness and Swelling of Extremities. Neurological Not Present- Decreased Memory, Fainting, Headaches, Numbness, Seizures, Tingling, Tremor, Trouble walking and Weakness. Psychiatric Not Present- Anxiety, Bipolar, Change in Sleep Pattern, Depression, Fearful and Frequent crying. Endocrine Not Present- Cold Intolerance, Excessive Hunger, Hair Changes, Heat Intolerance, Hot flashes and New Diabetes. Hematology Not Present- Easy Bruising, Excessive bleeding, Gland problems, HIV and Persistent Infections.  Vitals Miranda Endo Dockery LPN; 02/15/863 7:84 PM) 04/19/2016 2:26 PM Weight: 156 lb Height: 64in Body Surface Area:  1.76 m Body Mass Index: 26.78 kg/m  Temp.: 97.1F(Oral)  Pulse: 68 (Regular)  BP: 126/74 (Sitting, Left Arm, Standard)      Physical Exam Miranda Levee MD; 04/19/2016 3:01 PM)  General Mental Status-Alert. General Appearance-Cooperative, Not in acute distress.  Orientation-Oriented X4.  Integumentary General Characteristics Overall examination of the patient's skin reveals - no rashes. Color - normal coloration of skin. Skin Moisture - normal skin moisture.  Head and Neck Head-normocephalic, atraumatic with no lesions or palpable masses.  Chest and Lung Exam Chest and lung exam reveals -quiet, even and easy respiratory effort with no use of accessory muscles.  Cardiovascular Cardiovascular examination reveals -normal heart sounds, regular rate and rhythm with no murmurs.  Abdomen Palpation/Percussion Palpation and Percussion of the abdomen reveal - Soft and Non Tender.  Rectal Note: seton in place  Neurologic Neurologic evaluation reveals -normal attention span and ability to concentrate and able to name objects and repeat phrases. Appropriate fund of knowledge .  Musculoskeletal Global Assessment Gait and Station - normal gait and station and normal posture.    Assessment & Plan Miranda Dominguez(Miranda Coia MD; 04/19/2016 2:56 PM)  ANAL FISTULA (K60.3) Impression: 56yo F with an anal fistula s/p seton placement. She is ready for a definitive repair. We will plan on doing this in early April but may push it out further if she finds a job between now and then. We discussed the risks of a LIFT procedure which are bleeding, infection and a small risk of incontinence and recurrence.

## 2016-06-07 ENCOUNTER — Encounter (HOSPITAL_BASED_OUTPATIENT_CLINIC_OR_DEPARTMENT_OTHER): Payer: Self-pay | Admitting: *Deleted

## 2016-06-07 NOTE — Progress Notes (Signed)
NPO AFTER MN.  ARRIVE AT 0930.  NEEDS ISTAT 8.   CURRENT EKG IN CHART AND EPIC.  

## 2016-06-18 ENCOUNTER — Ambulatory Visit (HOSPITAL_BASED_OUTPATIENT_CLINIC_OR_DEPARTMENT_OTHER)
Admission: RE | Admit: 2016-06-18 | Discharge: 2016-06-18 | Disposition: A | Discharge status: Home / Self Care | Payer: BLUE CROSS/BLUE SHIELD | Source: Ambulatory Visit | Attending: General Surgery | Admitting: General Surgery

## 2016-06-18 ENCOUNTER — Encounter (HOSPITAL_BASED_OUTPATIENT_CLINIC_OR_DEPARTMENT_OTHER)
Admission: RE | Disposition: A | Discharge status: Home / Self Care | Payer: Self-pay | Source: Ambulatory Visit | Attending: General Surgery

## 2016-06-18 ENCOUNTER — Ambulatory Visit (HOSPITAL_BASED_OUTPATIENT_CLINIC_OR_DEPARTMENT_OTHER): Payer: BLUE CROSS/BLUE SHIELD | Admitting: Anesthesiology

## 2016-06-18 ENCOUNTER — Encounter (HOSPITAL_BASED_OUTPATIENT_CLINIC_OR_DEPARTMENT_OTHER): Payer: Self-pay

## 2016-06-18 DIAGNOSIS — Z79899 Other long term (current) drug therapy: Secondary | ICD-10-CM | POA: Insufficient documentation

## 2016-06-18 DIAGNOSIS — Z800 Family history of malignant neoplasm of digestive organs: Secondary | ICD-10-CM | POA: Diagnosis not present

## 2016-06-18 DIAGNOSIS — K611 Rectal abscess: Secondary | ICD-10-CM | POA: Diagnosis not present

## 2016-06-18 DIAGNOSIS — I10 Essential (primary) hypertension: Secondary | ICD-10-CM | POA: Diagnosis not present

## 2016-06-18 DIAGNOSIS — Z87891 Personal history of nicotine dependence: Secondary | ICD-10-CM | POA: Diagnosis not present

## 2016-06-18 DIAGNOSIS — Z90710 Acquired absence of both cervix and uterus: Secondary | ICD-10-CM | POA: Diagnosis not present

## 2016-06-18 DIAGNOSIS — K603 Anal fistula: Secondary | ICD-10-CM | POA: Insufficient documentation

## 2016-06-18 DIAGNOSIS — Z8249 Family history of ischemic heart disease and other diseases of the circulatory system: Secondary | ICD-10-CM | POA: Insufficient documentation

## 2016-06-18 HISTORY — DX: Anal fistula, unspecified: K60.30

## 2016-06-18 HISTORY — DX: Anal fistula: K60.3

## 2016-06-18 HISTORY — DX: Adverse effect of unspecified anesthetic, initial encounter: T41.45XA

## 2016-06-18 HISTORY — DX: Presence of spectacles and contact lenses: Z97.3

## 2016-06-18 HISTORY — PX: LIGATION OF INTERNAL FISTULA TRACT: SHX6551

## 2016-06-18 HISTORY — DX: Other complications of anesthesia, initial encounter: T88.59XA

## 2016-06-18 SURGERY — LIGATION, INTERNAL FISTULA TRACT
Anesthesia: General | Site: Anus

## 2016-06-18 MED ORDER — ONDANSETRON HCL 4 MG/2ML IJ SOLN
INTRAMUSCULAR | Status: AC
  Filled 2016-06-18: qty 2

## 2016-06-18 MED ORDER — DEXTROSE 5 % IV SOLN
2.0000 g | INTRAVENOUS | Status: DC
  Filled 2016-06-18: qty 2

## 2016-06-18 MED ORDER — LIDOCAINE 5 % EX OINT
TOPICAL_OINTMENT | CUTANEOUS | Status: DC | PRN
  Administered 2016-06-18: 1

## 2016-06-18 MED ORDER — OXYCODONE HCL 5 MG PO TABS
5.0000 mg | ORAL_TABLET | ORAL | Status: DC | PRN
  Filled 2016-06-18: qty 2

## 2016-06-18 MED ORDER — LIDOCAINE 2% (20 MG/ML) 5 ML SYRINGE
INTRAMUSCULAR | Status: AC
  Filled 2016-06-18: qty 5

## 2016-06-18 MED ORDER — ACETAMINOPHEN 325 MG PO TABS
650.0000 mg | ORAL_TABLET | ORAL | Status: DC | PRN
  Filled 2016-06-18: qty 2

## 2016-06-18 MED ORDER — CEFOTETAN DISODIUM-DEXTROSE 2-2.08 GM-% IV SOLR
INTRAVENOUS | Status: AC
  Filled 2016-06-18: qty 50

## 2016-06-18 MED ORDER — SODIUM CHLORIDE 0.9 % IV SOLN
250.0000 mL | INTRAVENOUS | Status: DC | PRN
  Filled 2016-06-18: qty 250

## 2016-06-18 MED ORDER — OXYCODONE HCL 5 MG PO TABS: 5.0000 mg | ORAL_TABLET | ORAL | 0 refills | Status: AC | PRN

## 2016-06-18 MED ORDER — GABAPENTIN 300 MG PO CAPS
ORAL_CAPSULE | ORAL | Status: AC
  Filled 2016-06-18: qty 1

## 2016-06-18 MED ORDER — SUCCINYLCHOLINE CHLORIDE 200 MG/10ML IV SOSY
PREFILLED_SYRINGE | INTRAVENOUS | Status: AC
  Filled 2016-06-18: qty 10

## 2016-06-18 MED ORDER — ACETAMINOPHEN 10 MG/ML IV SOLN
INTRAVENOUS | Status: AC
  Filled 2016-06-18: qty 100

## 2016-06-18 MED ORDER — CELECOXIB 400 MG PO CAPS
400.0000 mg | ORAL_CAPSULE | ORAL | Status: AC
  Administered 2016-06-18: 400 mg via ORAL
  Filled 2016-06-18: qty 1

## 2016-06-18 MED ORDER — BUPIVACAINE LIPOSOME 1.3 % IJ SUSP
INTRAMUSCULAR | Status: DC | PRN
  Administered 2016-06-18: 20 mL

## 2016-06-18 MED ORDER — BUPIVACAINE-EPINEPHRINE 0.5% -1:200000 IJ SOLN
INTRAMUSCULAR | Status: DC | PRN
  Administered 2016-06-18: 30 mL

## 2016-06-18 MED ORDER — ACETAMINOPHEN 500 MG PO TABS
ORAL_TABLET | ORAL | Status: AC
  Filled 2016-06-18: qty 2

## 2016-06-18 MED ORDER — LACTATED RINGERS IV SOLN
INTRAVENOUS | Status: DC
  Administered 2016-06-18 (×2): via INTRAVENOUS
  Filled 2016-06-18: qty 1000

## 2016-06-18 MED ORDER — PROPOFOL 10 MG/ML IV BOLUS
INTRAVENOUS | Status: DC | PRN
  Administered 2016-06-18: 200 mg via INTRAVENOUS

## 2016-06-18 MED ORDER — SODIUM CHLORIDE 0.9% FLUSH
3.0000 mL | Freq: Two times a day (BID) | INTRAVENOUS | Status: DC
  Filled 2016-06-18: qty 3

## 2016-06-18 MED ORDER — MIDAZOLAM HCL 2 MG/2ML IJ SOLN
INTRAMUSCULAR | Status: AC
  Filled 2016-06-18: qty 2

## 2016-06-18 MED ORDER — DIAZEPAM 5 MG PO TABS: 5.0000 mg | ORAL_TABLET | Freq: Four times a day (QID) | ORAL | 0 refills | Status: AC | PRN

## 2016-06-18 MED ORDER — ACETAMINOPHEN 500 MG PO TABS
1000.0000 mg | ORAL_TABLET | ORAL | Status: AC
  Administered 2016-06-18: 1000 mg via ORAL
  Filled 2016-06-18: qty 2

## 2016-06-18 MED ORDER — MIDAZOLAM HCL 5 MG/5ML IJ SOLN
INTRAMUSCULAR | Status: DC | PRN
  Administered 2016-06-18: 2 mg via INTRAVENOUS

## 2016-06-18 MED ORDER — DEXAMETHASONE SODIUM PHOSPHATE 4 MG/ML IJ SOLN
INTRAMUSCULAR | Status: DC | PRN
  Administered 2016-06-18: 10 mg via INTRAVENOUS

## 2016-06-18 MED ORDER — LIDOCAINE HCL (CARDIAC) 20 MG/ML IV SOLN
INTRAVENOUS | Status: DC | PRN
  Administered 2016-06-18: 60 mg via INTRAVENOUS

## 2016-06-18 MED ORDER — FENTANYL CITRATE (PF) 100 MCG/2ML IJ SOLN
INTRAMUSCULAR | Status: AC
  Filled 2016-06-18: qty 2

## 2016-06-18 MED ORDER — ACETAMINOPHEN 10 MG/ML IV SOLN
INTRAVENOUS | Status: DC | PRN
  Administered 2016-06-18: 1000 mg via INTRAVENOUS

## 2016-06-18 MED ORDER — ONDANSETRON HCL 4 MG/2ML IJ SOLN
INTRAMUSCULAR | Status: DC | PRN
  Administered 2016-06-18: 4 mg via INTRAVENOUS

## 2016-06-18 MED ORDER — SODIUM CHLORIDE 0.9% FLUSH
3.0000 mL | INTRAVENOUS | Status: DC | PRN
  Filled 2016-06-18: qty 3

## 2016-06-18 MED ORDER — DEXAMETHASONE SODIUM PHOSPHATE 10 MG/ML IJ SOLN
INTRAMUSCULAR | Status: AC
Start: 2016-06-18 — End: 2016-06-18
  Filled 2016-06-18: qty 1

## 2016-06-18 MED ORDER — PROPOFOL 10 MG/ML IV BOLUS
INTRAVENOUS | Status: AC
  Filled 2016-06-18: qty 40

## 2016-06-18 MED ORDER — KETOROLAC TROMETHAMINE 30 MG/ML IJ SOLN
INTRAMUSCULAR | Status: DC | PRN
  Administered 2016-06-18: 30 mg via INTRAVENOUS

## 2016-06-18 MED ORDER — GABAPENTIN 300 MG PO CAPS
300.0000 mg | ORAL_CAPSULE | ORAL | Status: AC
  Administered 2016-06-18: 300 mg via ORAL
  Filled 2016-06-18: qty 1

## 2016-06-18 MED ORDER — ACETAMINOPHEN 650 MG RE SUPP
650.0000 mg | RECTAL | Status: DC | PRN
  Filled 2016-06-18: qty 1

## 2016-06-18 MED ORDER — SUCCINYLCHOLINE CHLORIDE 20 MG/ML IJ SOLN
INTRAMUSCULAR | Status: DC | PRN
  Administered 2016-06-18: 120 mg via INTRAVENOUS

## 2016-06-18 MED ORDER — BUPIVACAINE LIPOSOME 1.3 % IJ SUSP
INTRAMUSCULAR | Status: AC
  Filled 2016-06-18: qty 20

## 2016-06-18 MED ORDER — CELECOXIB 200 MG PO CAPS
ORAL_CAPSULE | ORAL | Status: AC
  Filled 2016-06-18: qty 2

## 2016-06-18 SURGICAL SUPPLY — 39 items
BENZOIN TINCTURE PRP APPL 2/3 (GAUZE/BANDAGES/DRESSINGS) ×2 IMPLANT
BLADE HEX COATED 2.75 (ELECTRODE) ×2 IMPLANT
BLADE SURG 15 STRL LF DISP TIS (BLADE) ×1 IMPLANT
BLADE SURG 15 STRL SS (BLADE) ×1
BRIEF STRETCH FOR OB PAD LRG (UNDERPADS AND DIAPERS) ×4 IMPLANT
CANISTER SUCT 3000ML PPV (MISCELLANEOUS) ×2 IMPLANT
COVER BACK TABLE 60X90IN (DRAPES) ×2 IMPLANT
COVER MAYO STAND STRL (DRAPES) ×2 IMPLANT
DRAPE LAPAROTOMY 100X72 PEDS (DRAPES) ×2 IMPLANT
DRAPE UTILITY XL STRL (DRAPES) ×2 IMPLANT
ELECT REM PT RETURN 9FT ADLT (ELECTROSURGICAL) ×2
ELECTRODE REM PT RTRN 9FT ADLT (ELECTROSURGICAL) ×1 IMPLANT
GLOVE BIO SURGEON STRL SZ 6.5 (GLOVE) ×2 IMPLANT
GLOVE INDICATOR 7.0 STRL GRN (GLOVE) ×2 IMPLANT
GOWN STRL REUS W/ TWL LRG LVL3 (GOWN DISPOSABLE) ×1 IMPLANT
GOWN STRL REUS W/ TWL XL LVL3 (GOWN DISPOSABLE) ×2 IMPLANT
GOWN STRL REUS W/TWL LRG LVL3 (GOWN DISPOSABLE) ×1
GOWN STRL REUS W/TWL XL LVL3 (GOWN DISPOSABLE) ×2
HYDROGEN PEROXIDE 16OZ (MISCELLANEOUS) ×2 IMPLANT
IV CATH 14GX2 1/4 (CATHETERS) ×2 IMPLANT
KIT RM TURNOVER CYSTO AR (KITS) ×2 IMPLANT
NS IRRIG 500ML POUR BTL (IV SOLUTION) ×2 IMPLANT
PACK BASIN DAY SURGERY FS (CUSTOM PROCEDURE TRAY) ×2 IMPLANT
PAD ARMBOARD 7.5X6 YLW CONV (MISCELLANEOUS) IMPLANT
PENCIL BUTTON HOLSTER BLD 10FT (ELECTRODE) ×2 IMPLANT
RETRACTOR STAY HOOK 5MM (MISCELLANEOUS) ×2 IMPLANT
RETRACTOR STERILE 25.8CMX11.3 (INSTRUMENTS) ×2 IMPLANT
SUCTION FRAZIER TIP 10 FR DISP (SUCTIONS) ×2 IMPLANT
SUT CHROMIC 2 0 SH (SUTURE) ×2 IMPLANT
SUT SILK 2 0 (SUTURE) ×2
SUT SILK 2-0 18XBRD TIE 12 (SUTURE) ×2 IMPLANT
SUT VIC AB 3-0 SH 8-18 (SUTURE) ×2 IMPLANT
SYR 20CC LL (SYRINGE) ×2 IMPLANT
SYR BULB IRRIGATION 50ML (SYRINGE) ×2 IMPLANT
SYR CONTROL 10ML LL (SYRINGE) ×2 IMPLANT
TOWEL OR 17X24 6PK STRL BLUE (TOWEL DISPOSABLE) ×2 IMPLANT
TRAY DSU PREP LF (CUSTOM PROCEDURE TRAY) ×2 IMPLANT
TUBE CONNECTING 12X1/4 (SUCTIONS) ×2 IMPLANT
YANKAUER SUCT BULB TIP NO VENT (SUCTIONS) ×2 IMPLANT

## 2016-06-18 NOTE — Discharge Instructions (Addendum)
°Post Anesthesia Home Care Instructions ° °Activity: °Get plenty of rest for the remainder of the day. A responsible individual must stay with you for 24 hours following the procedure.  °For the next 24 hours, DO NOT: °-Drive a car °-Operate machinery °-Drink alcoholic beverages °-Take any medication unless instructed by your physician °-Make any legal decisions or sign important papers. ° °Meals: °Start with liquid foods such as gelatin or soup. Progress to regular foods as tolerated. Avoid greasy, spicy, heavy foods. If nausea and/or vomiting occur, drink only clear liquids until the nausea and/or vomiting subsides. Call your physician if vomiting continues. ° °Special Instructions/Symptoms: °Your throat may feel dry or sore from the anesthesia or the breathing tube placed in your throat during surgery. If this causes discomfort, gargle with warm salt water. The discomfort should disappear within 24 hours. ° °If you had a scopolamine patch placed behind your ear for the management of post- operative nausea and/or vomiting: ° °1. The medication in the patch is effective for 72 hours, after which it should be removed.  Wrap patch in a tissue and discard in the trash. Wash hands thoroughly with soap and water. °2. You may remove the patch earlier than 72 hours if you experience unpleasant side effects which may include dry mouth, dizziness or visual disturbances. °3. Avoid touching the patch. Wash your hands with soap and water after contact with the patch. °  °ANORECTAL SURGERY: POST OP INSTRUCTIONS °1. Take your usually prescribed home medications unless otherwise directed. °2. DIET: During the first few hours after surgery sip on some liquids until you are able to urinate.  It is normal to not urinate for several hours after this surgery.  If you feel uncomfortable, please contact the office for instructions.  After you are able to urinate,you may eat, if you feel like it.  Follow a light bland diet the first 24  hours after arrival home, such as soup, liquids, crackers, etc.  Be sure to include lots of fluids daily (6-8 glasses).  Avoid fast food or heavy meals, as your are more likely to get nauseated.  Eat a low fat diet the next few days after surgery.  Limit caffeine intake to 1-2 servings a day. °3. PAIN CONTROL: °a. Pain is best controlled by a usual combination of several different methods TOGETHER: °i. Muscle relaxation °1.  Soak in a warm bath (or Sitz bath) three times a day and after bowel movements.  Continue to do this until all pain is resolved. °2. Take the muscle relaxer (Valium) every 6 hours for the first 2 days after surgery  °ii. Over the counter pain medication °iii. Prescription pain medication °b. Most patients will experience some swelling and discomfort in the anus/rectal area and incisions.  Heat such as warm towels, sitz baths, warm baths, etc to help relax tight/sore spots and speed recovery.  Some people prefer to use ice, especially in the first couple days after surgery, as it may decrease the pain and swelling, or alternate between ice & heat.  Experiment to what works for you.  Swelling and bruising can take several weeks to resolve.  Pain can take even longer to completely resolve. °c. It is helpful to take an over-the-counter pain medication regularly for the first few weeks.  Choose one of the following that works best for you: °i. Naproxen (Aleve, etc)  Two 220mg tabs twice a day °ii. Ibuprofen (Advil, etc) Three 200mg tabs four times a day (every meal & bedtime) °  d. A  prescription for pain medication (such as percocet, oxycodone, hydrocodone, etc) should be given to you upon discharge.  Take your pain medication as prescribed.  °i. If you are having problems/concerns with the prescription medicine (does not control pain, nausea, vomiting, rash, itching, etc), please call us [phone] to see if we need to switch you to a different pain medicine that will work better for you and/or  control your side effect better. °ii. If you need a refill on your pain medication, please contact your pharmacy.  They will contact our office to request authorization. Prescriptions will not be filled after 5 pm or on week-ends. °4. KEEP YOUR BOWELS REGULAR and AVOID CONSTIPATION °a. The goal is one to two soft bowel movements a day.  You should at least have a bowel movement every other day. °b. Avoid getting constipated.  Between the surgery and the pain medications, it is common to experience some constipation. This can be very painful after rectal surgery.  Increasing fluid intake and taking a fiber supplement (such as Metamucil, Citrucel, FiberCon, etc) 1-2 times a day regularly will usually help prevent this problem from occurring.  A stool softener like colace is also recommended.  This can be purchased over the counter at your pharmacy.  You can take it up to 3 times a day.  If you do not have a bowel movement after 24 hrs since your surgery, take one does of milk of magnesia.  If you still haven't had a bowel movement 8-12 hours after that dose, take another dose.  If you don't have a bowel movement 48 hrs after surgery, purchase a Fleets enema from the drug store and administer gently per package instructions.  If you still are having trouble with your bowel movements after that, please call the office for further instructions. °c. If you develop diarrhea or have many loose bowel movements, simplify your diet to bland foods & liquids for a few days.  Stop any stool softeners and decrease your fiber supplement.  Switching to mild anti-diarrheal medications (Kayopectate, Pepto Bismol) can help.  If this worsens or does not improve, please call us. ° °5. Wound Care °a. Remove your bandages before your first bowel movement or 8 hours after surgery.     °b. Remove any wound packing material at this tim,e as well.  You do not need to repack the wound unless instructed otherwise.  Wear an absorbent pad or soft  cotton gauze in your underwear to catch any drainage and help keep the area clean. You should change this every 2-3 hours while awake. °c. Keep the area clean and dry.  Bathe / shower every day, especially after bowel movements.  Keep the area clean by showering / bathing over the incision / wound.   It is okay to soak an open wound to help wash it.  Wet wipes or showers / gentle washing after bowel movements is often less traumatic than regular toilet paper. °d. You may have some styrofoam-like soft packing in the rectum which will come out with the first bowel movement.  °e. You will often notice bleeding with bowel movements.  This should slow down by the end of the first week of surgery °f. Expect some drainage.  This should slow down, too, by the end of the first week of surgery.  Wear an absorbent pad or soft cotton gauze in your underwear until the drainage stops. °g. Do Not sit on a rubber or pillow ring.    This can make you symptoms worse.  You may sit on a soft pillow if needed.  °6. ACTIVITIES as tolerated:   °a. You may resume regular (light) daily activities beginning the next day--such as daily self-care, walking, climbing stairs--gradually increasing activities as tolerated.  If you can walk 30 minutes without difficulty, it is safe to try more intense activity such as jogging, treadmill, bicycling, low-impact aerobics, swimming, etc. °b. Save the most intensive and strenuous activity for last such as sit-ups, heavy lifting, contact sports, etc  Refrain from any heavy lifting or straining until you are off narcotics for pain control.   °c. You may drive when you are no longer taking prescription pain medication, you can comfortably sit for long periods of time, and you can safely maneuver your car and apply brakes. °d. You may have sexual intercourse when it is comfortable.  °7. FOLLOW UP in our office °a. Please call [facility] at [phone] to set up an appointment to see your surgeon in the office for  a follow-up appointment approximately 3-4 weeks after your surgery. °b. Make sure that you call for this appointment the day you arrive home to insure a convenient appointment time. °10. IF YOU HAVE DISABILITY OR FAMILY LEAVE FORMS, BRING THEM TO THE OFFICE FOR PROCESSING.  DO NOT GIVE THEM TO YOUR DOCTOR. ° ° ° ° °WHEN TO CALL US [phone]: °1. Poor pain control °2. Reactions / problems with new medications (rash/itching, nausea, etc)  °3. Fever over 101.5 F (38.5 C) °4. Inability to urinate °5. Nausea and/or vomiting °6. Worsening swelling or bruising °7. Continued bleeding from incision. °8. Increased pain, redness, or drainage from the incision ° °The clinic staff is available to answer your questions during regular business hours (8:30am-5pm).  Please don’t hesitate to call and ask to speak to one of our nurses for clinical concerns.   A surgeon from [facility] is always on call at the hospitals °  °If you have a medical emergency, go to the nearest emergency room or call 911. °  ° °[facility] °[address], [address], [place], [place]  [address] ? °MAIN: [phone] ? TOLL FREE: [phone] ? °FAX [phone] °[email or web address] ° ° ° °

## 2016-06-18 NOTE — Anesthesia Preprocedure Evaluation (Signed)
Anesthesia Evaluation  Patient identified by MRN, date of birth, ID band Patient awake    Reviewed: Allergy & Precautions, NPO status , Patient's Chart, lab work & pertinent test results  Airway Mallampati: II  TM Distance: >3 FB Neck ROM: Full    Dental no notable dental hx.    Pulmonary neg pulmonary ROS,    Pulmonary exam normal breath sounds clear to auscultation       Cardiovascular hypertension, Normal cardiovascular exam Rhythm:Regular Rate:Normal     Neuro/Psych negative neurological ROS  negative psych ROS   GI/Hepatic negative GI ROS, (+)     substance abuse  alcohol use,   Endo/Other  negative endocrine ROS  Renal/GU negative Renal ROS  negative genitourinary   Musculoskeletal negative musculoskeletal ROS (+)   Abdominal   Peds negative pediatric ROS (+)  Hematology negative hematology ROS (+)   Anesthesia Other Findings   Reproductive/Obstetrics negative OB ROS                             Anesthesia Physical  Anesthesia Plan  ASA: III  Anesthesia Plan: General   Post-op Pain Management:    Induction: Intravenous  Airway Management Planned: Oral ETT  Additional Equipment:   Intra-op Plan:   Post-operative Plan: Extubation in OR  Informed Consent: I have reviewed the patients History and Physical, chart, labs and discussed the procedure including the risks, benefits and alternatives for the proposed anesthesia with the patient or authorized representative who has indicated his/her understanding and acceptance.   Dental advisory given  Plan Discussed with: CRNA and Surgeon  Anesthesia Plan Comments:         Anesthesia Quick Evaluation

## 2016-06-18 NOTE — Anesthesia Procedure Notes (Signed)
Procedure Name: Intubation Date/Time: 06/18/2016 11:06 AM Performed by: Justice Rocher Pre-anesthesia Checklist: Patient identified, Emergency Drugs available, Suction available and Patient being monitored Patient Re-evaluated:Patient Re-evaluated prior to inductionOxygen Delivery Method: Circle system utilized Preoxygenation: Pre-oxygenation with 100% oxygen Intubation Type: IV induction Ventilation: Mask ventilation without difficulty Laryngoscope Size: Mac and 3 Grade View: Grade II Tube type: Oral Number of attempts: 1 Airway Equipment and Method: Stylet and Oral airway Placement Confirmation: ETT inserted through vocal cords under direct vision,  positive ETCO2 and breath sounds checked- equal and bilateral Secured at: 24 cm Tube secured with: Tape Dental Injury: Teeth and Oropharynx as per pre-operative assessment

## 2016-06-18 NOTE — H&P (Signed)
The patient is a 56 year old female who presents with a perirectal abscess. Miranda Dominguez is a 56 year old patient who has a history of 3 previous perirectal abscesses. She underwent an exam under anesthesia previously in [place] in 2014 and a partial fistulotomy was performed with seton around the sphincter involved portion of the fistula. I assume that this seton was removed in the office. Since that time she has had further recurrences of her abscess. I recommended exam under anesthesia. Her fistula was noted to proceed through the entire external muscle complex. I placed a seton in late Dec 2017. She has healed well and has no complaints. 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 Medications Reconciled Atenolol (50MG  Tablet, Oral daily) Active.  Social History [name], MD; 04/19/2016 3:00 PM) Alcohol use Moderate alcohol use. Caffeine use Carbonated beverages. No drug use Tobacco use Former smoker.  Family History [name], MD; 04/19/2016 3:00 PM) Hypertension Mother. Malignant Neoplasm Of Pancreas Mother.  Pregnancy / Birth History [name], MD; 04/19/2016 3:00 PM) Age at menarche 13 years. Age of menopause 53-50  Other Problems [name], MD; 04/19/2016 3:00 PM) High blood  pressure     Review of Systems  General Not Present- Appetite Loss, Chills, Fatigue, Fever, Night Sweats, Weight Gain and Weight Loss. Skin Not Present- Change in Wart/Mole, Dryness, Hives, Jaundice, New Lesions, Non-Healing Wounds, Rash and Ulcer. HEENT Present- Wears glasses/contact lenses. Not Present- Earache, Hearing Loss, Hoarseness, Nose Bleed, Oral Ulcers, Ringing in the Ears, Seasonal Allergies, Sinus Pain, Sore Throat, Visual Disturbances and Yellow Eyes. Respiratory Not Present- Bloody sputum, Chronic Cough, Difficulty Breathing, Snoring and Wheezing. Breast Not Present- Breast Mass, Breast Pain, Nipple Discharge and Skin Changes. Cardiovascular Not Present- Chest Pain, Difficulty Breathing Lying Down, Leg Cramps, Palpitations, Rapid Heart Rate, Shortness of Breath and Swelling of Extremities. Gastrointestinal Present- Rectal Pain. Not Present- Abdominal Pain, Bloating, Bloody Stool, Change in Bowel Habits, Chronic diarrhea, Constipation, Difficulty Swallowing, Excessive gas, Gets full quickly at meals, Hemorrhoids, Indigestion, Nausea and Vomiting. Female Genitourinary Not Present- Frequency, Nocturia, Painful Urination, Pelvic Pain and Urgency. Musculoskeletal Not Present- Back Pain, Joint Pain, Joint Stiffness, Muscle Pain, Muscle Weakness and Swelling of Extremities. Neurological Not Present- Decreased Memory, Fainting, Headaches, Numbness, Seizures, Tingling, Tremor, Trouble walking and Weakness. Psychiatric Not Present- Anxiety, Bipolar, Change in Sleep Pattern, Depression, Fearful and Frequent crying. Endocrine Not Present- Cold Intolerance, Excessive Hunger, Hair Changes, Heat Intolerance, Hot flashes and New Diabetes. Hematology Not Present- Easy Bruising, Excessive bleeding, Gland problems, HIV and Persistent Infections.  BP 127/88 (BP Location: Left Arm, Patient Position: Sitting)   Pulse (!) 107   Temp 97.7 F (36.5 C) (Oral)   Resp 18   Ht 5\' 5"  (1.651 m)   Wt  66 kg (145 lb 8 oz)   SpO2 100%   BMI 24.21 kg/m      Physical Exam  General Mental Status-Alert. General Appearance-Cooperative, Not in acute distress. Orientation-Oriented  X4.  Integumentary General Characteristics Overall examination of the patient's skin reveals - no rashes. Color - normal coloration of skin. Skin Moisture - normal skin moisture.  Head and Neck Head-normocephalic, atraumatic with no lesions or palpable masses.  Chest and Lung Exam Chest and lung exam reveals -quiet, even and easy respiratory effort with no use of accessory muscles.  Cardiovascular Cardiovascular examination reveals -normal heart sounds, regular rate and rhythm with no murmurs.  Abdomen Palpation/Percussion Palpation and Percussion of the abdomen reveal - Soft and Non Tender.  Rectal Note: seton in place  Neurologic Neurologic evaluation reveals -normal attention span and ability to concentrate and able to name objects and repeat phrases. Appropriate fund of knowledge .  Musculoskeletal Global Assessment Gait and Station - normal gait and station and normal posture.    Assessment & Plan   ANAL FISTULA (K60.3) Impression: 56yo F with an anal fistula s/p seton placement. She is ready for a definitive repair. We will plan on doing this in early April but may push it out further if she finds a job between now and then. We discussed the risks of a LIFT procedure which are bleeding, infection and a small risk of incontinence and recurrence.

## 2016-06-18 NOTE — Op Note (Addendum)
06/18/2016  11:43 AM  PATIENT:  Miranda ModyLisa J Margulies  56 y.o. female  Patient Care Team: Darrow BussingKoirala, Dibas, MD as PCP - General (Family Medicine) Nyoka CowdenWert, Michael B, MD as Consulting Physician (Pulmonary Disease) Rachael FeeLugo, Irving A, MD as Consulting Physician (Psychiatry) Romie Leveehomas, Maddux First, MD as Consulting Physician (Colon and Rectal Surgery)  PRE-OPERATIVE DIAGNOSIS:  anal fistula  POST-OPERATIVE DIAGNOSIS:  anal fistula  PROCEDURE:  LIGATION OF INTERNAL FISTULA TRACT  SURGEON:  Surgeon(s): Romie Leveehomas, Aviraj Kentner, MD  ASSISTANT: none   ANESTHESIA:   local and general  SPECIMEN:  No Specimen  DISPOSITION OF SPECIMEN:  N/A  COUNTS:  YES  PLAN OF CARE: Discharge to home after PACU  PATIENT DISPOSITION:  PACU - hemodynamically stable.  INDICATION: 56 y.o. 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   OR FINDINGS: Right anterior perianal fistula  DESCRIPTION: the patient was identified in the preoperative holding area and taken to the OR where they were laid on the operating room table.  General anesthesia was induced without difficulty. The patient was then positioned in prone jackknife position with buttocks gently taped apart.  The patient was then prepped and draped in usual sterile fashion.  SCDs were noted to be in place prior to the initiation of anesthesia. A surgical timeout was performed indicating the correct patient, procedure, positioning and need for preoperative antibiotics.  A rectal block was performed using Marcaine with epinephrine mixed with Exparel.    I began with a digital rectal exam.  The seton was in place.  I then placed a Hill-Ferguson anoscope into the anal canal and evaluated this completely.  The fistula sight was clean with minimal purulence and a small internal opening.  I could easily place a fistula probe through this.  I made an incision over top the intersphincteric groove using a 15 blade scalpel. Dissection was carried down around  the fistula tract on both sides. I removed the seton. I used a [employer] cabin crew[employer]one Star retractor to help with visualization.  I dissected underneath the fistula tract with a right angle clamp. I then passed 2, 2-0 silk sutures around the fistula tract. The fistula probe was removed. The 2 silk sutures were tied tightly isolated distally. A 15 blade scalpel was used to divide the fistula tract. I then used a 3-0 chromic suture on both sides to oversew the silk suture. I tested the internal opening. There was still a small break in the mucosa. This was closed with a 3-0 Vicryl suture. I then injected hydrogen peroxide into the external opening and found no leak into the wound. I irrigated the wound with normal saline.   I reapproximated the intersphincteric groove using 2 3-0 Vicryl interrupted sutures. The skin of the incision was closed using interrupted 3-0 chromic sutures. I enlarged the external opening, using a 15 blade scalpel. This was also irrigated with saline.  Lidocaine ointment and sterile dressing was applied. The patient was awakened from anesthesia and sent to the postanesthesia care unit in stable condition. All counts were correct per operating room staff.   Patient will require short-term narcotic use for postoperative recovery. I have reviewed [place] database and see no other prescriptions except for the one that I gave her for her last surgery.

## 2016-06-18 NOTE — Transfer of Care (Signed)
Immediate Anesthesia Transfer of Care Note  Patient: Miranda Dominguez  Procedure(s) Performed: Procedure(s) (LRB): LIGATION OF INTERNAL FISTULA TRACT (N/A)  Patient Location: PACU  Anesthesia Type: General  Level of Consciousness: awake, sedated, patient cooperative and responds to stimulation  Airway & Oxygen Therapy: Patient Spontanous Breathing and Patient connected to Helper O2   Post-op Assessment: Report given to PACU RN, Post -op Vital signs reviewed and stable and Patient moving all extremities  Post vital signs: Reviewed and stable  Complications: No apparent anesthesia complications

## 2016-06-19 ENCOUNTER — Encounter (HOSPITAL_BASED_OUTPATIENT_CLINIC_OR_DEPARTMENT_OTHER): Payer: Self-pay | Admitting: General Surgery

## 2016-06-19 NOTE — Anesthesia Postprocedure Evaluation (Signed)
Anesthesia Post Note  Patient: Miranda ModyLisa J Dominguez  Procedure(s) Performed: Procedure(s) (LRB): LIGATION OF INTERNAL FISTULA TRACT (N/A)  Patient location during evaluation: PACU Anesthesia Type: General Level of consciousness: awake and alert Pain management: pain level controlled Vital Signs Assessment: post-procedure vital signs reviewed and stable Respiratory status: spontaneous breathing, nonlabored ventilation, respiratory function stable and patient connected to nasal cannula oxygen Cardiovascular status: blood pressure returned to baseline and stable Postop Assessment: no signs of nausea or vomiting Anesthetic complications: no       Last Vitals:  Vitals:   06/18/16 1245 06/18/16 1356  BP: 116/78 (!) 145/87  Pulse: 92 100  Resp: 14 14  Temp:  36.8 C    Last Pain:  Vitals:   06/18/16 1154  TempSrc:   PainSc: 0-No pain                 [name]

## 2016-06-26 LAB — POCT I-STAT, CHEM 8
BUN: 3 mg/dL — AB (ref 6–20)
CALCIUM ION: 1.11 mmol/L — AB (ref 1.15–1.40)
Chloride: 97 mmol/L — ABNORMAL LOW (ref 101–111)
Creatinine, Ser: 0.6 mg/dL (ref 0.44–1.00)
Glucose, Bld: 69 mg/dL (ref 65–99)
HCT: 47 % — ABNORMAL HIGH (ref 36.0–46.0)
HEMOGLOBIN: 16 g/dL — AB (ref 12.0–15.0)
Potassium: 3.2 mmol/L — ABNORMAL LOW (ref 3.5–5.1)
SODIUM: 138 mmol/L (ref 135–145)
TCO2: 26 mmol/L (ref 0–100)

## 2016-08-24 NOTE — Addendum Note (Signed)
Addendum  created 08/24/16 1230 by Octavie Westerhold, MD   Sign clinical note    

## 2016-08-24 NOTE — Anesthesia Postprocedure Evaluation (Signed)
Anesthesia Post Note  Patient: Miranda ModyLisa J Dominguez  Procedure(s) Performed: Procedure(s) (LRB): LIGATION OF INTERNAL FISTULA TRACT (N/A)     Anesthesia Post Evaluation  Last Vitals:  Vitals:   06/18/16 1245 06/18/16 1356  BP: 116/78 (!) 145/87  Pulse: 92 100  Resp: 14 14  Temp:  36.8 C    Last Pain:  Vitals:   06/18/16 1154  TempSrc:   PainSc: 0-No pain                 Abagail Limb EDWARD

## 2016-11-23 DIAGNOSIS — F1020 Alcohol dependence, uncomplicated: Secondary | ICD-10-CM | POA: Diagnosis not present

## 2016-11-23 DIAGNOSIS — L908 Other atrophic disorders of skin: Secondary | ICD-10-CM | POA: Diagnosis not present

## 2016-11-23 DIAGNOSIS — Z79899 Other long term (current) drug therapy: Secondary | ICD-10-CM | POA: Diagnosis not present

## 2016-11-23 DIAGNOSIS — I10 Essential (primary) hypertension: Secondary | ICD-10-CM | POA: Diagnosis not present

## 2018-05-19 DIAGNOSIS — M1712 Unilateral primary osteoarthritis, left knee: Secondary | ICD-10-CM | POA: Diagnosis not present

## 2018-05-19 DIAGNOSIS — M25562 Pain in left knee: Secondary | ICD-10-CM | POA: Diagnosis not present

## 2018-06-25 DIAGNOSIS — M1712 Unilateral primary osteoarthritis, left knee: Secondary | ICD-10-CM | POA: Diagnosis not present

## 2018-07-02 DIAGNOSIS — M1712 Unilateral primary osteoarthritis, left knee: Secondary | ICD-10-CM | POA: Diagnosis not present

## 2018-07-09 DIAGNOSIS — M1712 Unilateral primary osteoarthritis, left knee: Secondary | ICD-10-CM | POA: Diagnosis not present

## 2019-03-09 DIAGNOSIS — D869 Sarcoidosis, unspecified: Secondary | ICD-10-CM | POA: Diagnosis not present

## 2019-03-09 DIAGNOSIS — F330 Major depressive disorder, recurrent, mild: Secondary | ICD-10-CM | POA: Diagnosis not present

## 2019-03-09 DIAGNOSIS — I10 Essential (primary) hypertension: Secondary | ICD-10-CM | POA: Diagnosis not present

## 2019-03-09 DIAGNOSIS — Z13220 Encounter for screening for lipoid disorders: Secondary | ICD-10-CM | POA: Diagnosis not present

## 2019-03-09 DIAGNOSIS — Z0000 Encounter for general adult medical examination without abnormal findings: Secondary | ICD-10-CM | POA: Diagnosis not present

## 2019-03-11 ENCOUNTER — Other Ambulatory Visit: Payer: Self-pay | Admitting: Family Medicine

## 2019-03-11 DIAGNOSIS — Z1231 Encounter for screening mammogram for malignant neoplasm of breast: Secondary | ICD-10-CM

## 2019-04-14 ENCOUNTER — Ambulatory Visit: Payer: BLUE CROSS/BLUE SHIELD

## 2019-04-20 DIAGNOSIS — Z01419 Encounter for gynecological examination (general) (routine) without abnormal findings: Secondary | ICD-10-CM | POA: Diagnosis not present

## 2019-04-20 DIAGNOSIS — Z6823 Body mass index (BMI) 23.0-23.9, adult: Secondary | ICD-10-CM | POA: Diagnosis not present

## 2019-04-20 DIAGNOSIS — Z1231 Encounter for screening mammogram for malignant neoplasm of breast: Secondary | ICD-10-CM | POA: Diagnosis not present

## 2019-06-25 DIAGNOSIS — S20221A Contusion of right back wall of thorax, initial encounter: Secondary | ICD-10-CM | POA: Diagnosis not present

## 2019-06-25 DIAGNOSIS — M546 Pain in thoracic spine: Secondary | ICD-10-CM | POA: Diagnosis not present

## 2019-06-25 DIAGNOSIS — R0781 Pleurodynia: Secondary | ICD-10-CM | POA: Diagnosis not present

## 2019-07-16 DIAGNOSIS — D863 Sarcoidosis of skin: Secondary | ICD-10-CM | POA: Diagnosis not present

## 2019-07-16 DIAGNOSIS — R21 Rash and other nonspecific skin eruption: Secondary | ICD-10-CM | POA: Diagnosis not present

## 2019-07-29 DIAGNOSIS — L245 Irritant contact dermatitis due to other chemical products: Secondary | ICD-10-CM | POA: Diagnosis not present

## 2019-07-29 DIAGNOSIS — L718 Other rosacea: Secondary | ICD-10-CM | POA: Diagnosis not present

## 2019-07-29 DIAGNOSIS — L910 Hypertrophic scar: Secondary | ICD-10-CM | POA: Diagnosis not present

## 2019-08-07 ENCOUNTER — Emergency Department (HOSPITAL_COMMUNITY)
Admission: EM | Admit: 2019-08-07 | Discharge: 2019-08-08 | Disposition: A | Discharge status: Home / Self Care | Payer: BC Managed Care – PPO | Attending: Emergency Medicine | Admitting: Emergency Medicine

## 2019-08-07 ENCOUNTER — Other Ambulatory Visit: Payer: Self-pay

## 2019-08-07 ENCOUNTER — Emergency Department (HOSPITAL_COMMUNITY): Payer: BC Managed Care – PPO

## 2019-08-07 ENCOUNTER — Encounter (HOSPITAL_COMMUNITY): Payer: Self-pay | Admitting: Emergency Medicine

## 2019-08-07 DIAGNOSIS — Y929 Unspecified place or not applicable: Secondary | ICD-10-CM | POA: Insufficient documentation

## 2019-08-07 DIAGNOSIS — Y9301 Activity, walking, marching and hiking: Secondary | ICD-10-CM | POA: Insufficient documentation

## 2019-08-07 DIAGNOSIS — S8001XA Contusion of right knee, initial encounter: Secondary | ICD-10-CM | POA: Insufficient documentation

## 2019-08-07 DIAGNOSIS — T07XXXA Unspecified multiple injuries, initial encounter: Secondary | ICD-10-CM

## 2019-08-07 DIAGNOSIS — S0003XA Contusion of scalp, initial encounter: Secondary | ICD-10-CM | POA: Diagnosis not present

## 2019-08-07 DIAGNOSIS — S0181XA Laceration without foreign body of other part of head, initial encounter: Secondary | ICD-10-CM | POA: Diagnosis not present

## 2019-08-07 DIAGNOSIS — W109XXA Fall (on) (from) unspecified stairs and steps, initial encounter: Secondary | ICD-10-CM | POA: Diagnosis not present

## 2019-08-07 DIAGNOSIS — S80811A Abrasion, right lower leg, initial encounter: Secondary | ICD-10-CM | POA: Insufficient documentation

## 2019-08-07 DIAGNOSIS — Y999 Unspecified external cause status: Secondary | ICD-10-CM | POA: Diagnosis not present

## 2019-08-07 DIAGNOSIS — S80812A Abrasion, left lower leg, initial encounter: Secondary | ICD-10-CM | POA: Insufficient documentation

## 2019-08-07 DIAGNOSIS — S8002XA Contusion of left knee, initial encounter: Secondary | ICD-10-CM | POA: Insufficient documentation

## 2019-08-07 DIAGNOSIS — F10920 Alcohol use, unspecified with intoxication, uncomplicated: Secondary | ICD-10-CM

## 2019-08-07 DIAGNOSIS — Z23 Encounter for immunization: Secondary | ICD-10-CM | POA: Insufficient documentation

## 2019-08-07 DIAGNOSIS — T148XXA Other injury of unspecified body region, initial encounter: Secondary | ICD-10-CM

## 2019-08-07 DIAGNOSIS — R519 Headache, unspecified: Secondary | ICD-10-CM | POA: Insufficient documentation

## 2019-08-07 LAB — BASIC METABOLIC PANEL
Anion gap: 13 (ref 5–15)
BUN: 7 mg/dL (ref 6–20)
CO2: 22 mmol/L (ref 22–32)
Calcium: 8.7 mg/dL — ABNORMAL LOW (ref 8.9–10.3)
Chloride: 101 mmol/L (ref 98–111)
Creatinine, Ser: 0.59 mg/dL (ref 0.44–1.00)
GFR calc Af Amer: >60 mL/min (ref >60)
GFR calc non Af Amer: >60 mL/min (ref >60)
Glucose, Bld: 112 mg/dL — ABNORMAL HIGH (ref 70–99)
Potassium: 3.8 mmol/L (ref 3.5–5.1)
Sodium: 136 mmol/L (ref 135–145)

## 2019-08-07 LAB — CBC
HCT: 42.2 % (ref 36.0–46.0)
Hemoglobin: 14.5 g/dL (ref 12.0–15.0)
MCH: 33.3 pg (ref 26.0–34.0)
MCHC: 34.4 g/dL (ref 30.0–36.0)
MCV: 96.8 fL (ref 80.0–100.0)
Platelets: 154 10*3/uL (ref 150–400)
RBC: 4.36 MIL/uL (ref 3.87–5.11)
RDW: 13.5 % (ref 11.5–15.5)
WBC: 6.6 10*3/uL (ref 4.0–10.5)
nRBC: 0 % (ref 0.0–0.2)

## 2019-08-07 LAB — ETHANOL: Alcohol, Ethyl (B): 410 mg/dL (ref <10)

## 2019-08-07 NOTE — ED Notes (Addendum)
Pt reassessed after leaving to go to car and coming back. L pupil 24mm, R pupil 63mm both reactive. Dr. Juleen China called, given verbal for CT head

## 2019-08-07 NOTE — Progress Notes (Addendum)
I was leaving the hospital for the evening and while walking out noticed a trail of blood stains leaving the hospital down the stairs into the physician parking lot, leading to a [employer] car. Ms. Hove was inside her vehicle with a blood pressure cuff remnant on, trying to dial on her phone. I knocked on her car and asked if she was alright and she came out of the car and explained she had tripped, fallen down multiple stairs, and hit her head on the wall. She had a clean head dressing on and blood on her clothes and leg. She told me someone had come out and told her she needed stitches. It was initially unclear who she was referring to. 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 She was trying to reach her boss to get coverage to come pick up the specimens. She was able to reach him and had a coherent conversation about a coworker picking them up. Still, though, she could not recall where she had gotten the head dressing and complained of a headache. I offered to escort her back to the ED and she accepted (she declined a wheelchair and more urgent transport on stretcher or EMS assistance). As we walked to the ED she recalled checking in there earlier this evening. I notified triage of the situation who came and helped take it from there. They also planned to make security aware of the blood stains outside the hospital for cleanup. Shivaay Stormont PA-C

## 2019-08-07 NOTE — ED Triage Notes (Addendum)
Pt presents to ED POV. Pt reports that she was walking down stairs and tripped. Hit head on wall. Laceration to head and abrassion to R knee. bleeding controlled, no blood thinners, no LOC. Pt neuro intact, AAOx4

## 2019-08-08 MED ORDER — TETANUS-DIPHTH-ACELL PERTUSSIS 5-2.5-18.5 LF-MCG/0.5 IM SUSP
0.5000 mL | Freq: Once | INTRAMUSCULAR | Status: AC
  Administered 2019-08-08: 0.5 mL via INTRAMUSCULAR
  Filled 2019-08-08: qty 0.5

## 2019-08-08 MED ORDER — LIDOCAINE-EPINEPHRINE (PF) 2 %-1:200000 IJ SOLN
10.0000 mL | Freq: Once | INTRAMUSCULAR | Status: AC
  Administered 2019-08-08: 10 mL via INTRADERMAL
  Filled 2019-08-08: qty 20

## 2019-08-08 MED ORDER — BACITRACIN-NEOMYCIN-POLYMYXIN 400-5-5000 EX OINT: 1.0000 "application " | TOPICAL_OINTMENT | Freq: Two times a day (BID) | CUTANEOUS | 0 refills | Status: AC

## 2019-08-08 NOTE — ED Notes (Signed)
Pt verbalized understanding of d/c instruction, follow up care and s/s requiring return to ed. Pt had no further questions. Pt refused wheelchair and ambulated to exit.

## 2019-08-08 NOTE — ED Provider Notes (Signed)
LACERATION REPAIR Performed by: Arnoldo Hooker Authorized by: Arnoldo Hooker Consent: Verbal consent obtained. Risks and benefits: risks, benefits and alternatives were discussed Consent given by: patient Patient identity confirmed: provided demographic data Prepped and Draped in normal sterile fashion Wound explored  Laceration Location: forehead  Laceration Length: 2 cm  No Foreign Bodies seen or palpated  Anesthesia: local infiltration  Local anesthetic: lidocaine 2% w/epinephrine  Anesthetic total: 1 ml  Irrigation method: syringe Amount of cleaning: standard  Skin closure: 5-0 fast absorbing  Number of sutures: 2  Technique: simple interrupted  Patient tolerance: Patient tolerated the procedure well with no immediate complications.    Elpidio Anis, PA-C 08/08/19 0125    Derwood Kaplan, MD 08/08/19 [phone]

## 2019-08-08 NOTE — Discharge Instructions (Addendum)
You are seen in the ER for the laceration. The wound was repaired.   Keep the wound site clean and dry.  Apply the antibiotic ointment twice a day.  Avoid sun exposure to prevent/minimize scarring.  The sutures will dissolve on their own in couple of weeks.

## 2019-08-14 NOTE — ED Provider Notes (Signed)
[facility] EMERGENCY DEPARTMENT Provider Note   CSN: [id number] Arrival date & time: 08/07/19  2039     History Chief Complaint  Patient presents with  . Fall  . Head Laceration    Miranda Dominguez is a 59 y.o. female.  HPI    59 year old female comes in a chief complaint of fall and head laceration.  Patient reports that she had a mechanical fall as she was coming down the stairs.  Her head struck the wall and she fell down.  She is having bleeding from her forehead.  Patient is complaining of headache, neck pain.  She is denying pain elsewhere.  Patient's blood work was drawn in the triage and it shows alcohol intoxication.  Past Medical History:  Diagnosis Date  . Alcohol dependence (HCC)    has been in detox several times  . Anal fistula   . Bilateral sensorineural hearing loss 07/08/2015  . Complication of anesthesia    post op urinary retention (02-10-2016 surgery)  . Hypertension   . Sarcoidosis of skin    dx  2008  via bx ,  granulomatous dermatitis  . Sarcoidosis, lung (HCC) as of 02-03-2016  per pt asymptomatic   per office note dx  2008 by  dr wert (pulmologist) per ct,  pt with no signs or symptoms at that time  . Subjective tinnitus 07/08/2015  . Wears glasses   . Wears hearing aid    bilateral -- pt states only wears at work    Patient Active Problem List   Diagnosis Date Noted  . 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 Subjective tinnitus 07/08/2015  . Bilateral sensorineural hearing loss 07/08/2015  . 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 SARCOIDOSIS 01/02/2007  . HYPERTENSION 01/02/2007    Past Surgical History:  Procedure Laterality Date  . EVALUATION UNDER ANESTHESIA WITH FISTULECTOMY N/A 02/10/2016   Procedure: ANAL EXAM UNDER ANESTHESIA;  Surgeon: [name], MD;  Location: [facility];  Service: General;  Laterality: N/A;  . INCISION AND DRAINAGE ABSCESS ANAL  02/2015  . LIGATION  OF INTERNAL FISTULA TRACT N/A 06/18/2016   Procedure: LIGATION OF INTERNAL FISTULA TRACT;  Surgeon: [name], MD;  Location: [facility];  Service: General;  Laterality: N/A;  . MASS EXCISION Right 02/10/2016   Procedure: EXCISIONAL BIOPSY RIGHT NECK MASS;  Surgeon: [name], MD;  Location: [facility];  Service: General;  Laterality: Right;  . PLACEMENT OF SETON  08/07/2012   anal abscess  . PLACEMENT OF SETON N/A 02/10/2016   Procedure: PLACEMENT OF SETON;  Surgeon: [name], MD;  Location: [facility];  Service: General;  Laterality: N/A;  . ROBOTIC ASSISTED TOTAL HYSTERECTOMY  2009   w/  Bilateral Salpingoophorectomy     OB History   No obstetric history on file.     History reviewed. No pertinent family history.  Social History   Tobacco Use  . Smoking status: Never Smoker  . Smokeless tobacco: Never Used  Substance Use Topics  . Alcohol use: Yes    Comment: hx alcohol dependence -- PER PT 4 WINE WEEKLY  . Drug use: No    Home Medications Prior to Admission medications   Medication Sig Start Date End Date Taking? Authorizing Provider  atenolol (TENORMIN) 50 MG tablet Take 50 mg by mouth every morning.     [provider]  diazepam (VALIUM) 5 MG tablet Take 1 tablet (5 mg total)  by mouth every 6 (six) hours as needed (urinary retention). 06/18/16   [name], MD  hydrochlorothiazide (HYDRODIURIL) 25 MG tablet Take 25 mg by mouth every morning.     [provider]  neomycin-bacitracin-polymyxin (NEOSPORIN) ointment Apply 1 application topically every 12 (twelve) hours. 08/08/19   Derwood Kaplan, MD  oxyCODONE (OXY IR/ROXICODONE) 5 MG immediate release tablet Take 1-2 tablets (5-10 mg total) by mouth every 4 (four) hours as needed. 06/18/16   [name], MD    Allergies    Patient has no known allergies.  Review of Systems   Review of Systems  Constitutional: Positive for activity change.    Eyes: Negative for visual disturbance.  Respiratory: Negative for shortness of breath.   Cardiovascular: Negative for chest pain.  Musculoskeletal: Positive for arthralgias.  Skin: Positive for wound.  Neurological: Positive for headaches.  Hematological: Does not bruise/bleed easily.  All other systems reviewed and are negative.   Physical Exam Updated Vital Signs BP 116/79 (BP Location: Right Arm)   Pulse 92   Temp 98 F (36.7 C) (Oral)   Resp 16   Ht 5\' 5"  (1.651 m)   Wt 63.5 kg   SpO2 100%   BMI 23.30 kg/m   Physical Exam Vitals and nursing note reviewed.  Constitutional:      Appearance: She is well-developed.  HENT:     Head: Normocephalic and atraumatic.  Eyes:     Extraocular Movements: Extraocular movements intact.     Pupils: Pupils are equal, round, and reactive to light.  Cardiovascular:     Rate and Rhythm: Normal rate.  Pulmonary:     Effort: Pulmonary effort is normal.  Abdominal:     General: Bowel sounds are normal.  Musculoskeletal:        General: No swelling or deformity.     Cervical back: Normal range of motion and neck supple.  Skin:    General: Skin is warm and dry.     Comments: Patient has diffuse bruising over the knees bilaterally, right worse than left.  She also has a 2 cm laceration over her forehead.  Neurological:     Mental Status: She is alert and oriented to person, place, and time.     ED Results / Procedures / Treatments   Labs (all labs ordered are listed, but only abnormal results are displayed) Labs Reviewed  ETHANOL - Abnormal; Notable for the following components:      Result Value   Alcohol, Ethyl (B) 410 (*)    All other components within normal limits  BASIC METABOLIC PANEL - Abnormal; Notable for the following components:   Glucose, Bld 112 (*)    Calcium 8.7 (*)    All other components within normal limits  CBC    EKG None  Radiology No results found.  Procedures Procedures (including critical  care time)  Medications Ordered in ED Medications  Tdap (BOOSTRIX) injection 0.5 mL (0.5 mLs Intramuscular Given 08/08/19 0038)  lidocaine-EPINEPHrine (XYLOCAINE W/EPI) 2 %-1:200000 (PF) injection 10 mL (10 mLs Intradermal Given 08/08/19 0116)    ED Course  I have reviewed the triage vital signs and the nursing notes.  Pertinent labs & imaging results that were available during my care of the patient were reviewed by me and considered in my medical decision making (see chart for details).    MDM Rules/Calculators/A&P  59 year old female comes in a chief complaint of fall and resultant injury to her head and knees.  Patient is noted to have diffuse abrasions and skin tears over the lower extremity.  No gaping lacerations noted over the extremity.  She does have a laceration to her forehead, where the bleeding is now controlled.  The laceration was repaired by APP at my request.  From trauma perspective, we considered fractures, dislocation, head bleed, cord injury in the differential.  Patient has ambulated.  She had appropriate imaging and it is reassuring.  Family at the bedside will take patient home.  Final Clinical Impression(s) / ED Diagnoses Final diagnoses:  Forehead laceration, initial encounter  Hematoma  Abrasions of multiple sites  Alcoholic intoxication without complication (HCC)    Rx / DC Orders ED Discharge Orders         Ordered    neomycin-bacitracin-polymyxin (NEOSPORIN) ointment  Every 12 hours     Discontinue  Reprint     08/08/19 0126           Derwood Kaplan, MD 08/14/19 1555

## 2019-08-21 ENCOUNTER — Ambulatory Visit (HOSPITAL_COMMUNITY): Payer: Self-pay | Admitting: Licensed Clinical Social Worker

## 2019-08-24 ENCOUNTER — Telehealth (HOSPITAL_COMMUNITY): Payer: Self-pay | Admitting: Licensed Clinical Social Worker

## 2019-08-27 ENCOUNTER — Ambulatory Visit (INDEPENDENT_AMBULATORY_CARE_PROVIDER_SITE_OTHER): Payer: BC Managed Care – PPO | Admitting: Licensed Clinical Social Worker

## 2019-08-27 ENCOUNTER — Other Ambulatory Visit: Payer: Self-pay

## 2019-08-27 DIAGNOSIS — F1020 Alcohol dependence, uncomplicated: Secondary | ICD-10-CM

## 2019-08-30 NOTE — Progress Notes (Signed)
Comprehensive Clinical Assessment (CCA) Note  08/27/2019 Miranda Dominguez [id number]  Visit Diagnosis:      ICD-10-CM   1. Alcoholism ([place])  F10.20       CCA Screening, Triage and Referral (STR)  Patient Reported Information How did you hear about [name]? Previous client in [place] program Have You Ever Received Services From [facility] Before? 1998, 2010 Who Do You See at [facility]? CDIOP program  Have You Recently Had Any Thoughts About Hurting Yourself? No data recorded Are You Planning to Commit Suicide/Harm Yourself At This time? No data recorded  Have you Recently Had Thoughts About [place]? No data recorded Explanation: No data recorded  Have You Used Any Alcohol or Drugs in the Past 24 Hours? No data recorded How Long Ago Did You Use Drugs or Alcohol? No data recorded What Did You Use and How Much? No data recorded  Do You Currently Have a Therapist/Psychiatrist? No data recorded Name of Therapist/Psychiatrist: No data recorded  Have You Been Recently Discharged From Any Office Practice or Programs? No data recorded Explanation of Discharge From Practice/Program: No data recorded    CCA Screening Triage Referral Assessment Type of Contact: No data recorded Is this Initial or Reassessment? No data recorded Date Telepsych consult ordered in [facility]:  No data recorded Time Telepsych consult ordered in [facility]:  No data recorded  Patient Reported Information Reviewed? No data recorded Patient Left Without Being Seen? No data recorded Reason for Not Completing Assessment: No data recorded  Collateral Involvement: No data recorded  Does Patient Have a [place]? No data recorded Name and Contact of Legal Guardian: No data recorded If Minor and Not Living with Parent(s), Who has Custody? No data recorded Is CPS involved or ever been involved? No data recorded Is APS involved or ever been involved? No data recorded  Patient Determined To Be  At Risk for Harm To Self or Others Based on Review of Patient Reported Information or Presenting Complaint? No data recorded Method: No data recorded Availability of Means: No data recorded Intent: No data recorded Notification Required: No data recorded Additional Information for Danger to Others Potential: No data recorded Additional Comments for Danger to Others Potential: No data recorded Are There Guns or Other Weapons in Your Home? NA Types of Guns/Weapons: NA Are These Weapons Safely Secured?                            NA Who Could Verify You Are Able To Have These Secured: NA Do You Have any Outstanding Charges, Pending Court Dates, Parole/Probation? none Contacted To Inform of Risk of Harm To Self or Others: NA Location of Assessment: [place] OPT  Does Patient Present under Involuntary Commitment? no IVC Papers Initial File Date: [place] of Residence: [place]  Patient Currently Receiving the Following Services: none  Determination of Need: No data recorded  Options For Referral: cdiop    CCA Biopsychosocial  Intake/Chief Complaint:  CCA Intake With Chief Complaint CCA Part Two Date: 08/27/19 Chief Complaint/Presenting Problem: alcohol abuse Patient's Currently Reported Symptoms/Problems: depression, anxiety, work problems, alcohol abuse Individual's Preferences: cdiop Type of Services Patient Feels Are Needed: IOP Initial Clinical Notes/Concerns: see below  Client is a 59 year old female, self-referred for [place]. Client requesting services following 5 month relapse on alcohol resulting in a fall during work and head injury while intoxicated. Client reports stressors leading to relapse include difficult route  at work, COVID, being homesick (problem since childhood), and 59 year old dog being ill and considerations for being put down.   Client reports shifting work schedule causing stress and changes in sleeping patterns. Client does acknowledge this has attributed to  increased drinking. Client received her full citizenship obtained 2 years ago and is now a dual citizen of [name] and [name]. Client was born and raised in [name] by her biological parents. Client has 2 older brothers but states due to age difference she feels she was raised as an only child. Client reports often becoming homesick even from a young age.  Client parents both deceased. Positive relationship growing up and supportive overall as an adult. Bothers 59 and 26 years older in [name]; get to see very rarely; Miranda Dominguez oldest brother had 76 years of falling out with inheritance, reconnected after wife passed away now talking 2x wkly Other brother is 'black sheep' he's different than Miranda Dominguez and Miranda Dominguez 'he does his own thing, hes happy, has a good life' in education department and has nice retirement, he doesn't call me I have to call him Brothers know about 1998 drinking; Recently clients oldest brother contacted partner Miranda Dominguez and put her in awkward position asking about drinking; Client has a current partner Miranda Dominguez of 10 years 'she said we're co-dependent' shes not a drinking at all; she is concerned about drinking; worried about job" Hx married 1988-divored after 7.5 years 'we were golf buddies from college, not [place] love' (female) prior to that relationship with girl 'love of my life' (married him b/c it was the thing to do and what my parents expected (lived in [place] working for [employer]), Moved to [place] for his job 'fell apart' 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 Client later moved to [place] states and completed college in [place]. TREATMENT HISTORY: Hx of CDIOP program in 1998 and 2010. Following 2010 tx client reported around 3 years of sobriety. Prior to relapse in February client reporting around 1.5 years of sobriety. Client reports being 1.5 years sober, prior to that drinking binge  of 5 months; ' I dont remember how long had been drinking, had put on a lot of weight' Client reports over last year: got comfortable in my sobriety, first drink 'okay.then got back into the routine.' After 2010, going to aa but found it depressing. Client reports falling on the stairs while on the job and endorses regularly drinking/driving on the job. Client reports telling work she needed help and her boss stating she was a 'liability to the company' and needed to return her work car. Per AUDIT client drinks 5-6 drinks (light beer) 4 ore more times weekly and at least monthly has been unable to stop. Client believes it will be fairly difficult to stop drinking but is motivated for health and to save job. Client reports first use of alcohol at age 29 (legal drinking age in [name]). Client got married in 1988 and were drinking together, client reports attending rehab in 1998 following a divorce and drinking wine daily. Client attended detox and IOP followed by around 5 years of sobriety Client attended [facility] CDIOP program in 2010 followed by 3 years of sobriety. Client was unable to provide timeline for substance use between 2013 and 2019. Client maintained sobriety for around 1-1.5 years before current 5 month relapse drinking around 6 beers several times weekly. Client reports using cocaine 1 time in college and trying  tomorrow one time at age 28. Client denies any other substance use currently or previously.    Mental Health Symptoms Depression:  Depression: Change in energy/activity, Increase/decrease in appetite, Irritability, Weight gain/loss, Duration of symptoms greater than two weeks ('stupid sleeping patterns, thats why missed apt friday'; reports several MH sx likely attributd to substance use)  Mania:  Mania: N/A  Anxiety:   Anxiety: Irritability, Fatigue, Worrying (worrying about keeping job)  Psychosis:  Psychosis: None  Trauma:  Trauma: None (client denies)  Obsessions:  Obsessions:  None  Compulsions:  Compulsions: None  Inattention:  Inattention: None  Hyperactivity/Impulsivity:  Hyperactivity/Impulsivity: N/A  Oppositional/Defiant Behaviors:  Oppositional/Defiant Behaviors: None  Emotional Irregularity:  Emotional Irregularity: None (client denies)  Other Mood/Personality Symptoms:      Mental Status Exam Appearance and self-care  Stature:  Stature: Average  Weight:  Weight: Average weight  Clothing:  Clothing: Casual  Grooming:  Grooming: Normal  Cosmetic use:  Cosmetic Use: None  Posture/gait:  Posture/Gait: Normal  Motor activity:  Motor Activity: Restless  Sensorium  Attention:  Attention: Confused (my mind goes all over the world, can't quite the mind down)  Concentration:  Concentration: Normal  Orientation:  Orientation: X5  Recall/memory:  Recall/Memory: Normal  Affect and Mood  Affect:  Affect: Flat  Mood:  Mood: Anxious, Dysphoric (mild depressive sx due to dirnking)  Relating  Eye contact:  Eye Contact: Normal  Facial expression:  Facial Expression: Responsive  Attitude toward examiner:  Attitude Toward Examiner: Cooperative  Thought and Language  Speech flow: Speech Flow: Normal  Thought content:  Thought Content: Appropriate to Mood and Circumstances  Preoccupation:     Hallucinations:  Hallucinations: None  Organization:     Transport planner of Knowledge:  Fund of Knowledge: Average  Intelligence:  Intelligence: Average  Abstraction:  Abstraction: Normal  Judgement:  Judgement: Poor  Reality Testing:  Reality Testing: Adequate  Insight:  Insight: Fair  Decision Making:  Decision Making: Impulsive, Normal  Social Functioning  Social Maturity:  Social Maturity: Responsible (denies impulsive behaviors or isolating)  Social Judgement:  Social Judgement: Normal  Stress  Stressors:  Stressors: Work, Museum/gallery curator ('just alcoholism', worried about being scammed so worried about finances possibly)  Coping Ability:  Coping Ability:  Normal  Skill Deficits:  Skill Deficits: Responsibility, Interpersonal  Supports:  Supports: Family (partner supportive, oldest brother supportive)     Religion: Religion/Spirituality Are You A Religious Person?: Yes (went to catholic school and pray every day) How Might This Affect Treatment?: hx AA  Leisure/Recreation: Leisure / Recreation Do You Have Hobbies?: Yes Leisure and Hobbies: golf, walking  Exercise/Diet: Exercise/Diet Do You Exercise?: Yes What Type of Exercise Do You Do?: Run/Walk (golfing) How Many Times a Week Do You Exercise?:  (walking less after fall) Have You Gained or Lost A Significant Amount of Weight in the Past Six Months?: No (reports hx weight gain while drinking) Do You Follow a Special Diet?: No Do You Have Any Trouble Sleeping?: No   CCA Employment/Education  Employment/Work Situation: Employment / Work Situation Employment situation: Employed Where is patient currently employed?: [employer] How long has patient been employed?: 1.5 years Patient's job has been impacted by current illness: Yes Describe how patient's job has been impacted: fell on stairs while intoxicated on the job, drinking while working, missing appointments What is the longest time patient has a held a 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 company changed names ([employer])  Has patient ever been in the [employer]?: No  Education: Education Is Patient Currently Attending School?: No Last Grade Completed: 13 Did You Graduate From [employer]?: Yes Did You Attend College?: Yes What Type of College Degree Do you Have?: B.S. 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; client notes 'golf was the scholarship and my major really' Did You Have Any Special Interests In School?: golf Did You Have An Individualized Education Program (IIEP): No Did You Have Any Difficulty At School?: No Patient's Education Has  Been Impacted by Current Illness: No   CCA Family/Childhood History  Family and Relationship History: Family history Marital status: Long term relationship Long term relationship, how long?: 10 year relationship with partner Miranda Dominguez What types of issues is patient dealing with in the relationship?: client partner worried about client alcohol use Additional relationship information: met 'love of life' but married ex-husband, divorced and increased drinking Are you sexually active?: Yes Does patient have children?: No  Childhood History:  Childhood History By whom was/is the patient raised?: Both parents Additional childhood history information: grew up in [name] 'i was spoiled rotten, that was the problem' Description of patient's relationship with caregiver when they were a child: raised as only child due to brothers being 6 and 52 years older; no negative experiences; mother suffered from depression (found out as adult from brother) Patient's description of current relationship with people who raised him/her: parents deceased How were you disciplined when you got in trouble as a child/adolescent?: ' i was a good kid,, i didnt need to be disciplined' went to catholic school (brothers went to public school) Does patient have siblings?: Yes Number of Siblings: 2 Description of patient's current relationship with siblings: older brother supportive and worried about sobriety, came when went to rehab in 1988; speaks weekly with one brother Did patient suffer any verbal/emotional/physical/sexual abuse as a child?: No Did patient suffer from severe childhood neglect?: No Has patient ever been sexually abused/assaulted/raped as an adolescent or adult?: No Was the patient ever a victim of a crime or a disaster?: No Witnessed domestic violence?: No Has patient been affected by domestic violence as an adult?: No  Child/Adolescent Assessment:     CCA Substance Use  Alcohol/Drug Use: Alcohol  / Drug Use Pain Medications: none Prescriptions: see MAR Over the Counter: see MAR History of alcohol / drug use?: Yes Longest period of sobriety (when/how long): 3-5 years (?) Negative Consequences of Use: Work / Youth worker, Museum/gallery curator, Personal relationships Withdrawal Symptoms: Agitation, Patient aware of relationship between substance abuse and physical/medical complications (reports detox x 2) Substance #1 Name of Substance 1: alcohol 1 - Age of First Use: 19 1 - Amount (size/oz): 6-12 beers 1 - Frequency: 4 or more x wkly 1 - Duration: 5 months (current relapse) 1 - Last Use / Amount: "last week" 08/2019 Substance #2 Name of Substance 2: marijuana 2 - Age of First Use: college? 2 - Amount (size/oz): edible 2 - Frequency: 1x 2 - Duration: 1x 2 - Last Use / Amount: college Substance #3 Name of Substance 3: cocaine 3 - Age of First Use: college 3 - Amount (size/oz): unk 3 - Frequency: 1time 3 - Duration: 1 time 3 - Last Use / Amount: college/unk amount                   ASAM's:  Six Dimensions of Multidimensional Assessment  Dimension 1:  Acute Intoxication and/or Withdrawal Potential:   Dimension 1:  Description of individual's past and current experiences of substance use and withdrawal: recent use resulting in +tox screen at ER; reports detox x 2 in 1998 and 2010; does not require detox at this time  Dimension 2:  Biomedical Conditions and Complications:   Dimension 2:  Description of patient's biomedical conditions and  complications: sarcoidosis, hx high BP, recent head injury due to falling while intoxicated  Dimension 3:  Emotional, Behavioral, or Cognitive Conditions and Complications:  Dimension 3:  Description of emotional, behavioral, or cognitive conditions and complications: reports increased depression due to increased drinking, stress at work and worry about finances; noted not taking medications for depression, had difficulty with timeline for use/sobriety   Dimension 4:  Readiness to Change:  Dimension 4:  Description of Readiness to Change criteria: would like to make sure she does not lose her job causing anxiety  Dimension 5:  Relapse, Continued use, or Continued Problem Potential:  Dimension 5:  Relapse, continued use, or continued problem potential critiera description: unable to give timeline of sobriety or stressors prior to relapses. reports completing programs previously and able to maintain some sobriety for more than 1 year at a time  Dimension 6:  Recovery/Living Environment:  Dimension 6:  Recovery/Iiving environment criteria description: supportive partner, work said turn in car due to 'being a liability'  ASAM Severity Score: ASAM's Severity Rating Score: 12  ASAM Recommended Level of Treatment: ASAM Recommended Level of Treatment: Level II Intensive Outpatient Treatment   Substance use Disorder (SUD) Substance Use Disorder (SUD)  Checklist Symptoms of Substance Use: Continued use despite having a persistent/recurrent physical/psychological problem caused/exacerbated by use, Continued use despite persistent or recurrent social, interpersonal problems, caused or exacerbated by use, Evidence of tolerance, Presence of craving or strong urge to use, Recurrent use that results in a failure to fulfill major role obligations (work, school, home), Repeated use in physically hazardous situations, Substance(s) often taken in larger amounts or over longer times than was intended, Large amounts of time spent to obtain, use or recover from the substance(s)  Recommendations for Services/Supports/Treatments: Recommendations for Services/Supports/Treatments Recommendations For Services/Supports/Treatments: CD-IOP Intensive Chemical Dependency Program  DSM5 Diagnoses: Patient Active Problem List   Diagnosis Date Noted  . 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 Subjective tinnitus 07/08/2015  . Bilateral sensorineural hearing loss  07/08/2015  . 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 SARCOIDOSIS 01/02/2007  . HYPERTENSION 01/02/2007    Patient Centered Plan: Patient is on the following Treatment Plan(s):  Substance Abuse   Referrals to Alternative Service(s): Referred to Alternative Service(s):   Place:   Date:   Time:    Referred to Alternative Service(s):   Place:   Date:   Time:    Referred to Alternative Service(s):   Place:   Date:   Time:    Referred to Alternative Service(s):   Place:   Date:   Time:     Olegario Messier, LCSW, LCAS

## 2019-08-31 ENCOUNTER — Encounter (HOSPITAL_COMMUNITY): Payer: Self-pay | Admitting: Medical

## 2019-08-31 ENCOUNTER — Other Ambulatory Visit (HOSPITAL_COMMUNITY): Payer: BC Managed Care – PPO | Attending: Psychiatry | Admitting: Licensed Clinical Social Worker

## 2019-08-31 ENCOUNTER — Other Ambulatory Visit: Payer: Self-pay

## 2019-08-31 DIAGNOSIS — Z6372 Alcoholism and drug addiction in family: Secondary | ICD-10-CM | POA: Diagnosis not present

## 2019-08-31 DIAGNOSIS — F19939 Other psychoactive substance use, unspecified with withdrawal, unspecified: Secondary | ICD-10-CM

## 2019-08-31 DIAGNOSIS — F10980 Alcohol use, unspecified with alcohol-induced anxiety disorder: Secondary | ICD-10-CM | POA: Diagnosis not present

## 2019-08-31 DIAGNOSIS — F19239 Other psychoactive substance dependence with withdrawal, unspecified: Secondary | ICD-10-CM

## 2019-08-31 DIAGNOSIS — F1020 Alcohol dependence, uncomplicated: Secondary | ICD-10-CM | POA: Diagnosis present

## 2019-08-31 DIAGNOSIS — Z79899 Other long term (current) drug therapy: Secondary | ICD-10-CM | POA: Insufficient documentation

## 2019-08-31 DIAGNOSIS — F10280 Alcohol dependence with alcohol-induced anxiety disorder: Secondary | ICD-10-CM | POA: Diagnosis not present

## 2019-08-31 DIAGNOSIS — G251 Drug-induced tremor: Secondary | ICD-10-CM

## 2019-08-31 DIAGNOSIS — R232 Flushing: Secondary | ICD-10-CM

## 2019-08-31 MED ORDER — BACLOFEN 10 MG PO TABS: 10.0000 mg | ORAL_TABLET | Freq: Three times a day (TID) | ORAL | 1 refills | Status: AC

## 2019-08-31 MED ORDER — PREGABALIN 150 MG PO CAPS: 150.0000 mg | ORAL_CAPSULE | Freq: Three times a day (TID) | ORAL | 2 refills | Status: AC

## 2019-08-31 NOTE — Progress Notes (Signed)
Psychiatric Initial Adult Assessment   Patient Identification: Miranda Dominguez MRN:  [id number] Date of Evaluation:  08/31/2019 Referral Source: PREVIOUS CD IOP PATIENT SELF REFERRED Chief Complaint:   Chief Complaint    Establish Care; Alcohol Problem; Anxiety     Visit Diagnosis:    ICD-10-CM   1. Alcohol use disorder, severe, dependence (HCC)  F10.20 Hepatic function panel  2. Alcohol-induced anxiety disorder (HCC)  F10.980   3. Tremor due to drug withdrawal (HCC)  F19.239    G25.1    Alcohol  4. Plethora  R23.2   5. Alcoholism in family  Z63.72     History of Present Illness:   Client is a 59 year old female, self-referred for CDIOP. Client requesting services following 5 month relapse on alcohol resulting in a fall during work and head injury while intoxicated.   [facility] EMERGENCY DEPARTMENT Provider Note CSN: [id number] Arrival date & time: 08/07/19  524 HPI 59 year old female comes in a chief complaint of fall and head laceration.  Patient reports that she had a mechanical fall as she was coming down the stairs.  Her head struck the wall and she fell down.  She is having bleeding from her forehead.  Patient is complaining of headache, neck pain.  She is denying pain elsewhere.  Patient's blood work was drawn in the triage and it shows alcohol intoxication. Results for KYMBERLYN, ECKFORD (MRN [id number]) as of 09/05/2019 17:20  Ref. Range 08/07/2019 20:51  Alcohol, Ethyl (B) Latest Ref Range: <10 mg/dL 778 (HH)    Hx of CDIOP program in 1998 and 2010. Following 2010 tx client reported around 3 years of sobriety. Prior to relapse in February client reporting around  1.5 years of sobriety.Client reports stressors leading to relapse include difficult route at work, COVID, being homesick (problem since childhood), and 82 year old dog being ill and considerations for being put down Claims last drink "last week"? Associated Signs/Symptoms:: DSM V SUD Cruteria-10/11 + = aud severe dependence ASAM's:  Six Dimensions of Multidimensional Assessment Dimension 1:  Acute Intoxication and/or Withdrawal Potential:   Dimension 1:  Description of individual's past and current experiences of substance use and withdrawal: recent use resulting in +tox screen at ER; reports detox x 2 in 1998 and 2010; does not require detox at this time  Dimension 2:  Biomedical Conditions and Complications:   Dimension 2:  Description of patient's biomedical conditions and  complications: sarcoidosis, hx high BP, recent head injury due to falling while intoxicated  Dimension 3:  Emotional, Behavioral, or Cognitive Conditions and Complications:  Dimension 3:  Description of emotional, behavioral, or cognitive conditions and complications: reports increased depression due to increased drinking, stress at work and worry about finances; noted not taking medications for depression, had difficulty with timeline for use/sobriety  Dimension 4:  Readiness to Change:  Dimension 4:  Description of Readiness to Change criteria: would like to make sure she does not lose her job causing anxiety  Dimension 5:  Relapse, Continued use, or Continued Problem Potential:  Dimension 5:  Relapse, continued use, or continued problem potential critiera description: unable to give timeline of sobriety or stressors prior to relapses. reports completing programs previously and able to maintain some sobriety for more than 1 year at a time  Dimension 6:  Recovery/Living Environment:  Dimension 6:  Recovery/Iiving environment criteria description: supportive partner, work said turn in car due to 'being a liability'  ASAM Severity  Score: ASAM's Severity Rating  Score: 12  ASAM Recommended Level of Treatment: ASAM Recommended Level of Treatment: Level II Intensive Outpatient Treatment  AUDIT Total score 15  Dependence score 4 +Question 10 + Probable alcoholism  Depression Symptoms:PHQ 2 SCORE 0   fatigue, feelings of worthlessness/guilt, (Hypo) Manic Symptoms:   Anxiety Symptoms:GAD 7 SCORE 5   Excessive Worry, ALCOHOL WITHDRAWAL Psychotic Symptoms:  NA PTSD Symptoms: Negative  Past Psychiatric History: [facility] CD IOP 1998 and 2010 Dr Dub Mikes  Previous Psychotropic Medications: Yes   Substance Abuse History in the last 12 months:   Substance Abuse History in the last 12 months: Substance Age of 1st Use Last Use Amount Specific Type  Nicotine NEVER     Alcohol 19 Lqst week 6-12 beers  daily   Cannabis [employer]  1x   Opiates      Cocaine [employer]  1x   Methamphetamines      LSD      Ecstasy      Benzodiazepines      Caffeine      Inhalants      Others:                          Consequences of Substance Abuse: Medical Consequences: FALL INJURY Legal Consequences:   Family Consequences:   Blackouts:  YES DT's: NO Withdrawal Symptoms:   Headaches Nausea Tremors ANXIETY   Past Medical History:  Past Medical History:  Diagnosis Date  . Alcohol dependence (HCC)    has been in detox several times  . Anal fistula   . Bilateral sensorineural hearing loss 07/08/2015  . Complication of anesthesia    post op urinary retention (02-10-2016 surgery)  . Hypertension   . Sarcoidosis of skin    dx  2008  via bx ,  granulomatous dermatitis  . Sarcoidosis, lung (HCC) as of 02-03-2016  per pt asymptomatic   per office note dx  2008 by  dr wert (pulmologist) per ct,  pt with no signs or symptoms at that time  . Subjective tinnitus 07/08/2015  . Wears glasses   . Wears hearing aid    bilateral -- pt states only wears at work    Past Surgical History:  Procedure Laterality Date  . EVALUATION UNDER ANESTHESIA  WITH FISTULECTOMY N/A 02/10/2016   Procedure: ANAL EXAM UNDER ANESTHESIA;  Surgeon: [name], MD;  Location: [facility];  Service: General;  Laterality: N/A;  . INCISION AND DRAINAGE ABSCESS ANAL  02/2015  . LIGATION OF INTERNAL FISTULA TRACT N/A 06/18/2016   Procedure: LIGATION OF INTERNAL FISTULA TRACT;  Surgeon: [name], MD;  Location: [facility];  Service: General;  Laterality: N/A;  . MASS EXCISION Right 02/10/2016   Procedure: EXCISIONAL BIOPSY RIGHT NECK MASS;  Surgeon: [name], MD;  Location: [facility];  Service: General;  Laterality: Right;  . PLACEMENT OF SETON  08/07/2012   anal abscess  . PLACEMENT OF SETON N/A 02/10/2016   Procedure: PLACEMENT OF SETON;  Surgeon: [name], MD;  Location: [facility];  Service: General;  Laterality: N/A;  . ROBOTIC ASSISTED TOTAL HYSTERECTOMY  2009   w/  Bilateral Salpingoophorectomy    Family Psychiatric History: from [place]-culture considers alcoholic drinking "Normal"  Family History:  Hypertension Mother. Malignant Neoplasm Of Pancreas Mother.  Social History:   Social History   Socioeconomic History  . Marital status: Single    Spouse name: Olegario Messier   . Number of  children: No  . Years of education:   . 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 Fired by [employer]  Tobacco Use  . Smoking status: Never Smoker  . Smokeless tobacco: Never Used  Substance and Sexual Activity  . Alcohol use: Yes    Comment: hx alcohol dependence -- PER PT 4 WINE WEEKLY  . Drug use: No  . Sexual activity: Yes  Other Topics Concern  . Long term relationship, how long?: 10 year relationship with partner Olegario Messier What types of issues is patient dealing with in the relationship?: client partner worried about client alcohol use  Social History Narrative  .     Social Determinants of Health   Financial Resource Strain:   . Difficulty of Paying Living Expenses:   Food Insecurity:   . Worried About Programme researcher, broadcasting/film/video in the Last Year:   . Barista in the Last Year:   Transportation Needs:   . Freight forwarder (Medical):   Marland Kitchen Lack of Transportation (Non-Medical):   Physical Activity:   . Days of Exercise per Week: Do You Exercise?: Yes What Type of Exercise Do You Do?: Run/Walk (golfing) How Many Times a Week Do You Exercise?:  (walking less after fall)  . Minutes of Exercise per Session:   Stress:   . Feeling of Stress : Stressors:  Stressors: Work, Surveyor, quantity ('just alcoholism', worried about being scammed so worried about finances possibly Leisure / Recreation Do You Have Hobbies?: Yes Leisure and Hobbies: golf, walking   Social Connections:   . Frequency of Communication with Friends and Family: parents deceased  . Frequency of Social Gatherings with Friends and Family: Number of Siblings: 2 Description of patient's current relationship with siblings: older brother supportive and worried about sobriety, came when went to rehab in 1988; speaks weekly with one brother  . Attends Religious Services: Religion/Spirituality Are You A Religious Person?: Yes (went to catholic school and pray every day) How Might This Affect Treatment?: hx AA   . Active Member of Clubs or Organizations:   . Attends Banker Meetings:  hx AA  .     Additional Social History:   Allergies:  No Known Allergies  Metabolic Disorder Labs: HGBA1C, MPG  Results for SHIRL, WEIR (MRN [id number]) as of 09/05/2019 17:20  Ref. Range 08/07/2019 20:51  Glucose Latest Ref Range: 70 - 99 mg/dL 244 (H)   No results found for: PROLACTIN No results found for: CHOL, TRIG, HDL, CHOLHDL, VLDL, LDLCALC No results found for: TSH Results for AOIFE, BOLD (MRN [id number]) as of 09/05/2019 17:20  Ref. Range 10/31/2008 09:40  COMPREHENSIVE  METABOLIC PANEL Unknown Rpt (A)  Sodium Latest Ref Range: 135 - 145 mEq/L 135  Potassium Latest Ref Range: 3.5 - 5.1 mEq/L 3.3 (L)  Chloride Latest Ref Range: 96 - 112 mEq/L 103  CO2 Latest Ref Range: 19 - 32 mEq/L 24  Glucose Latest Ref Range: 70 - 99 mg/dL 644 (H)  BUN Latest Ref Range: 6 - 23 mg/dL 8  Creatinine Latest Ref Range: 0.40 - 1.20 mg/dL 0.34  Calcium Latest Ref Range: 8.4 - 10.5 mg/dL 8.6  Alkaline Phosphatase Latest Ref Range: 39 - 117 U/L 45  Albumin Latest Ref Range: 3.5 - 5.2 g/dL 3.6  Lipase Latest Ref Range: 11 - 59 U/L 41  AST Latest Ref Range: 0 - 37 U/L 59 (H)  ALT Latest Ref Range: 0 - 35 U/L 23  Total Protein Latest Ref Range: 6.0 - 8.3 g/dL 7.2  Total Bilirubin Latest Ref Range: 0.3 - 1.2 mg/dL 0.6  GFR, Est Non African American Latest Ref Range: >60 mL/min >60  GFR, Est African American Latest Ref Range: >60 mL/min >60       ...   Therapeutic Level Labs:NA  Current Medications: Current Outpatient Medications  Medication Sig Dispense Refill  . atenolol (TENORMIN) 50 MG tablet Take 50 mg by mouth every morning.     . baclofen (LIORESAL) 10 MG tablet Take 1 tablet (10 mg total) by mouth 3 (three) times daily. 90 tablet 1  . diazepam (VALIUM) 5 MG tablet Take 1 tablet (5 mg total) by mouth every 6 (six) hours as needed (urinary retention). 10 tablet 0  . hydrochlorothiazide (HYDRODIURIL) 25 MG tablet Take 25 mg by mouth every morning.     . neomycin-bacitracin-polymyxin (NEOSPORIN) ointment Apply 1 application topically every 12 (twelve) hours. 15 g 0  . oxyCODONE (OXY IR/ROXICODONE) 5 MG immediate release tablet Take 1-2 tablets (5-10 mg total) by mouth every 4 (four) hours as needed. 30 tablet 0  . pregabalin (LYRICA) 150 MG capsule Take 1 capsule (150 mg total) by mouth 3 (three) times daily. 60 capsule 2   No current facility-administered medications for this visit.    Musculoskeletal: Strength & Muscle Tone: within normal limits, flaccid and  SHAKEY Gait & Station: unsteady Patient leans: Right and N/A  Psychiatric Specialty Exam: Review of Systems  Constitutional: Positive for activity change, appetite change, diaphoresis, fatigue and unexpected weight change (LOSS 10 LBS). Negative for chills and fever.  HENT: Positive for hearing loss (HAS AIDS SHE USES FOR WORK). Negative for congestion, dental problem, drooling, ear discharge, ear pain, facial swelling, mouth sores, nosebleeds, postnasal drip, rhinorrhea, sinus pressure, sinus pain, sneezing, sore throat, tinnitus, trouble swallowing and voice change.   Eyes: Negative for photophobia, pain, discharge, redness, itching and visual disturbance.       WEARS GLASSES  Respiratory: Negative for apnea, cough, choking, chest tightness, shortness of breath, wheezing and stridor.        SARCOIDOSIS ASYMPTOMATIC  Cardiovascular: Negative for chest pain, palpitations and leg swelling.  Gastrointestinal: Positive for anal bleeding (FISTULA) and rectal pain. Negative for abdominal distention, abdominal pain, blood in stool, constipation, diarrhea, nausea and vomiting.  Endocrine: Negative for cold intolerance, heat intolerance, polydipsia, polyphagia and polyuria.  Genitourinary: Negative for decreased urine volume, difficulty urinating, dyspareunia, dysuria, enuresis, flank pain, frequency, genital sores, hematuria, menstrual problem, pelvic pain, urgency, vaginal bleeding, vaginal discharge and vaginal pain.  Musculoskeletal: Negative for arthralgias, back pain, gait problem, joint swelling, myalgias, neck pain and neck stiffness.  Skin: Positive for rash (SARCOIDOSIS). Negative for color change, pallor and wound.  Allergic/Immunologic: Negative for environmental allergies, food allergies and immunocompromised state.  Neurological: Positive for tremors and syncope. Negative for dizziness, seizures, facial asymmetry, speech difficulty, weakness, light-headedness, numbness and headaches.   Hematological: Negative for adenopathy. Does not bruise/bleed easily.  Psychiatric/Behavioral: Positive for behavioral problems, dysphoric mood, self-injury and sleep disturbance. Negative for agitation, confusion, decreased concentration, hallucinations and suicidal ideas. The patient is nervous/anxious. The patient is not hyperactive.     There were no vitals taken for this visit.There is no height or weight on file to calculate BMI.  General Appearance: Casual and PLETHORIC,TREMULOUS  Eye Contact:  Fair  Speech:  Clear and Coherent and HESITANT AT TIMES  Volume:  Normal  Mood:  Anxious  Affect:  Congruent  Thought Process:  Coherent and Descriptions of Associations: Intact  Orientation:  Full (Time, Place, and Person)  Thought Content:  WDL, Logical, Illogical and Rumination  Suicidal Thoughts:  No  Homicidal Thoughts:  No  Memory:  Negative hX BLACKOUTS  Judgement:  Impaired  Insight:  Lacking  Psychomotor Activity:  Tremor  Concentration:  Concentration: Fair and Attention Span: Fair  Recall:  [employer] of Knowledge:WDL  Language: Good  Akathisia:  NA  Handed:  Right  AIMS (if indicated):  NA  Assets:  Desire for Improvement Financial Resources/Insurance Housing Intimacy Resilience Social Support Talents/Skills Transportation Vocational/Educational  ADL's:  Intact  Cognition: Impaired,  Moderate  Sleep:  Fair   Screenings: GAD-7     Counselor from 08/31/2019 in BEHAVIORAL HEALTH INTENSIVE CHEMICAL DEPENDENCY  Total GAD-7 Score 5    PHQ2-9     Counselor from 08/31/2019 in BEHAVIORAL HEALTH INTENSIVE CHEMICAL DEPENDENCY  PHQ-2 Total Score 0     Results for RENU, ASBY (MRN [id number]) as of 08/31/2019 14:50  Ref. Range 08/07/2019 20:51  Alcohol, Ethyl (B) Latest Ref Range: <10 mg/dL 921 (HH)    Assessment: LATE STAGE ALCOHOLISM (JELLINEK) SEEKING TO ARREST PROGRESSION BUT HAVING DIFFICULTY ACCEPTING   and Plan:  Treatment Plan/Recommendations:  Plan  of Care: SUD/CORE ISSUES [facility] OP CDIOP`SEE COUNSELOR'S INDIVIDUALIXED TREATMENT PLAN  Laboratory:  UDS PER PROTOCOL  Psychotherapy:IOP GROUP;iNDIVIDUAL; FAMILY  Medications: MAT BACLOFEN LYRICA FOR ANXIETY WITHDRAWAL  Routine PRN Medications:  NONE FROM IOP  Consultations: NO  Safety Concerns:  INABILITY TO ABSTAIN/DRIVING WHILE DRINKING   Other:  RESIDENTIAL REFERRAL IF UNABLE TO ABSTAIN   Pt was fired from job after intake and withdrew-Counselor providesd with options  Maryjean Morn, PA-C 7/19/20215:00 PM

## 2019-09-02 ENCOUNTER — Encounter (HOSPITAL_COMMUNITY): Payer: Self-pay

## 2019-09-02 NOTE — Progress Notes (Signed)
° ° °  Daily Group Progress Note ° °Program: CD-IOP ° ° °Group Time: 1pm-2:30pm °Participation Level: Active °Behavioral Response: Appropriate °Type of Therapy: Process Group °Topic: Clinician met with group members, assessing for SI/HI/psychosis and overall level of functioning. Clinician inquired about recent sobriety date and number of 12 step community meetings attended. Clinician and group members processed things going well and not well over the weekend as well as any recent triggers or challenges to sobriety. Clinician and group members read and discussed NA Just for Today and AA Daily reflection. Clinician provided mindfulness activity for group participation in session. ° °Group Time: 2:30pm-4pm °Participation Level: Active °Behavioral Response: Appropriate °Type of Therapy: Psycho-education Group °Topic: Clinician presented the psycho-educational session topic of boundaries. Clinician and group members reviewed traits of loose, rigid and healthy boundaries and discussed examples of each. Clinician and group discussed what healthy vs unhealthy boundaries could look like and factors effecting setting and maintaining boundaries. Clinician and group members reviewed common challenges to maintaining boundaries and problem solved tools to enforce healthy boundaries. Clinician and group members discussed how boundaries were effected by substance use and goals for new boundaries to implement in early recovery. ° ° ° °Summary: Client reported a sobriety date of August 08, 2018. Client checked in with concerns about losing job due to missing work related to substance use and seeking treatment. Client was able to identify examples of both healthy and loose boundaries in her life currently and prevoiusly and how she addressed these. See PA note for additional details. ° ° °Family Program: Family present? No  ° Name of family member(s): N/A ° °UDS collected: Yes Results: pending ° °AA/NA attended?: No ° Sponsor?:  No ° ° °Karissa A Brone, LCSW ° ° ° ° ° ° ° °

## 2019-09-03 ENCOUNTER — Encounter (HOSPITAL_COMMUNITY): Payer: Self-pay

## 2019-09-07 ENCOUNTER — Encounter (HOSPITAL_COMMUNITY): Payer: Self-pay

## 2019-09-07 NOTE — Progress Notes (Signed)
CDIOP                                                                                                                               [facility] OUTPATIENT                                                      Discharge Summary                                                                                                                                                                     Date of Admission: 08/31/2019 Referall Provider:Patient and [facility] Date of Discharge: 09/02/2019 Sobriety Date:Claimed Week of July 12 but UDS was + for alcohol Admission Diagnosis: 1. Alcohol use disorder, severe, dependence (HCC)  F10.20 Hepatic function panel  2. Alcohol-induced anxiety disorder (HCC)  F10.980   3. Tremor due to drug withdrawal (HCC)  F19.239    G25.1    Alcohol  4. Plethora  R23.2   5. Alcoholism in family  Z48.72     Course of Treatment: Pt attende 1 group after which she was notified she had been r terminated and would have no insurance. She called Counselor and withdrew from CD IOP despite offer of Scholarship. Medications: Medication Sig Dispense Refill  . atenolol (TENORMIN) 50 MG tablet Take 50 mg by mouth every morning.     . baclofen (LIORESAL) 10 MG tablet Take 1 tablet (10 mg total) by mouth 3 (three) times daily. 90 tablet 1  . diazepam (VALIUM) 5 MG tablet Take 1 tablet (5 mg total) by mouth every 6 (six) hours as needed (urinary retention). 10 tablet 0  . hydrochlorothiazide (HYDRODIURIL) 25 MG tablet Take 25 mg by mouth every morning.     . neomycin-bacitracin-polymyxin (NEOSPORIN) ointment Apply 1 application topically every 12 (twelve) hours. 15 g 0  . oxyCODONE (OXY IR/ROXICODONE) 5 MG immediate release tablet Take 1-2 tablets (5-10 mg total) by mouth every 4 (four) hours as needed. 30 tablet 0  . pregabalin (LYRICA) 150  MG capsule Take 1 capsule (150 mg total) by mouth 3 (three) times  daily. 60 capsule 2    Discharge Diagnosis: 1. Alcohol use disorder, severe, dependence (HCC)  F10.20 Hepatic function panel  2. Alcohol-induced anxiety disorder (HCC)  F10.980   3. Tremor due to drug withdrawal (HCC)  F19.239    G25.1    Alcohol  4. Plethora  R23.2   5. Alcoholism in family  Z80.72     Plan of Action to Address Continuing Problems:  Goals and Activities to Help Maintain Sobriety: 1. Stay away from people ,places and things that are triggers 2. Continue practicing Fair Fighting rules in interpersonal conflicts. 3. Continue alcohol and drug refusal skills and call on support system  4. Attend AA/NA meetings AT LEAST as often as you use  5. Obtain a sponsor and a home group in AA/NA. 6. Return to PCP  Referrals: Counselor provided options  Next appointment: NA  Prognosis: Guarded to Poor    Client has NOT participated in the development of this discharge plan and has NOT received a copy of this completed plan  Patient ID: Miranda Dominguez, female   DOB: September 23, 1960, 59 y.o.   MRN: [id number]

## 2019-09-09 ENCOUNTER — Encounter (HOSPITAL_COMMUNITY): Payer: Self-pay

## 2019-09-09 DIAGNOSIS — H903 Sensorineural hearing loss, bilateral: Secondary | ICD-10-CM | POA: Diagnosis not present

## 2019-09-14 ENCOUNTER — Encounter (HOSPITAL_COMMUNITY): Payer: Self-pay

## 2019-09-16 ENCOUNTER — Encounter (HOSPITAL_COMMUNITY): Payer: Self-pay

## 2019-09-17 ENCOUNTER — Encounter (HOSPITAL_COMMUNITY): Payer: Self-pay

## 2019-09-21 ENCOUNTER — Encounter (HOSPITAL_COMMUNITY): Payer: Self-pay

## 2019-09-23 ENCOUNTER — Encounter (HOSPITAL_COMMUNITY): Payer: Self-pay

## 2019-09-24 ENCOUNTER — Encounter (HOSPITAL_COMMUNITY): Payer: Self-pay

## 2019-09-28 ENCOUNTER — Encounter (HOSPITAL_COMMUNITY): Payer: Self-pay

## 2019-09-30 ENCOUNTER — Encounter (HOSPITAL_COMMUNITY): Payer: Self-pay

## 2019-10-01 ENCOUNTER — Encounter (HOSPITAL_COMMUNITY): Payer: Self-pay

## 2019-10-05 ENCOUNTER — Encounter (HOSPITAL_COMMUNITY): Payer: Self-pay

## 2019-10-07 ENCOUNTER — Encounter (HOSPITAL_COMMUNITY): Payer: Self-pay

## 2019-10-08 ENCOUNTER — Encounter (HOSPITAL_COMMUNITY): Payer: Self-pay

## 2019-10-12 ENCOUNTER — Encounter (HOSPITAL_COMMUNITY): Payer: Self-pay

## 2019-10-14 ENCOUNTER — Encounter (HOSPITAL_COMMUNITY): Payer: Self-pay

## 2019-10-15 ENCOUNTER — Encounter (HOSPITAL_COMMUNITY): Payer: Self-pay

## 2019-10-21 ENCOUNTER — Encounter (HOSPITAL_COMMUNITY): Payer: Self-pay

## 2019-10-22 ENCOUNTER — Encounter (HOSPITAL_COMMUNITY): Payer: Self-pay

## 2019-10-26 ENCOUNTER — Encounter (HOSPITAL_COMMUNITY): Payer: Self-pay

## 2019-10-28 ENCOUNTER — Encounter (HOSPITAL_COMMUNITY): Payer: Self-pay

## 2019-10-29 ENCOUNTER — Encounter (HOSPITAL_COMMUNITY): Payer: Self-pay

## 2019-10-29 ENCOUNTER — Other Ambulatory Visit: Payer: Self-pay

## 2019-10-29 DIAGNOSIS — Z20822 Contact with and (suspected) exposure to covid-19: Secondary | ICD-10-CM

## 2019-10-29 NOTE — Addendum Note (Signed)
Addended by: Margaretmary Lombard on: 10/29/2019 11:16 AM   Modules accepted: Orders

## 2019-10-29 NOTE — Progress Notes (Signed)
Patient had exposure to positive COVID personnel and PCR testing is required.

## 2019-10-31 LAB — SARS-COV-2, NAA 2 DAY TAT

## 2019-10-31 LAB — NOVEL CORONAVIRUS, NAA: SARS-CoV-2, NAA: NOT DETECTED

## 2019-10-31 LAB — SPECIMEN STATUS REPORT

## 2019-11-02 ENCOUNTER — Encounter (HOSPITAL_COMMUNITY): Payer: Self-pay

## 2019-11-04 ENCOUNTER — Encounter (HOSPITAL_COMMUNITY): Payer: Self-pay

## 2019-11-05 ENCOUNTER — Encounter (HOSPITAL_COMMUNITY): Payer: Self-pay

## 2019-11-09 ENCOUNTER — Encounter (HOSPITAL_COMMUNITY): Payer: Self-pay

## 2019-11-11 ENCOUNTER — Encounter (HOSPITAL_COMMUNITY): Payer: Self-pay

## 2019-11-12 ENCOUNTER — Encounter (HOSPITAL_COMMUNITY): Payer: Self-pay

## 2021-01-29 IMAGING — CT CT HEAD W/O CM
4 series · 16 of 47 positions shown, 18 images · non-contrast
Comparison: None.

CLINICAL DATA: Fall down stairs

EXAM:
CT HEAD WITHOUT CONTRAST
TECHNIQUE: Contiguous axial images were obtained from the base of the skull
through the vertex without intravenous contrast.

[Series 3: head without · axial · non-contrast · 0.50mm/px · z∈[-181,-56]mm · 7 of 35 slices shown, 9 images]
[im 5/35  brain]
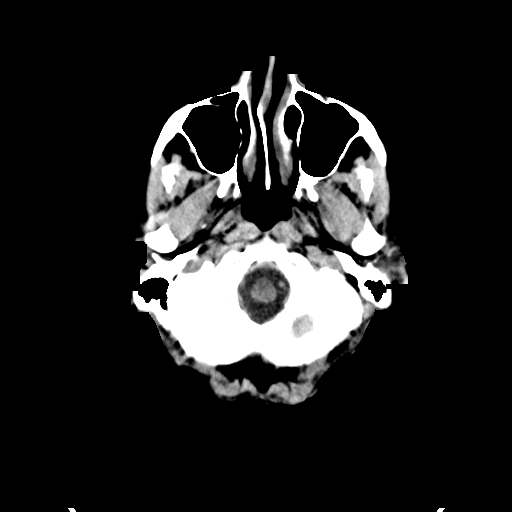
[im 5/35  bone]
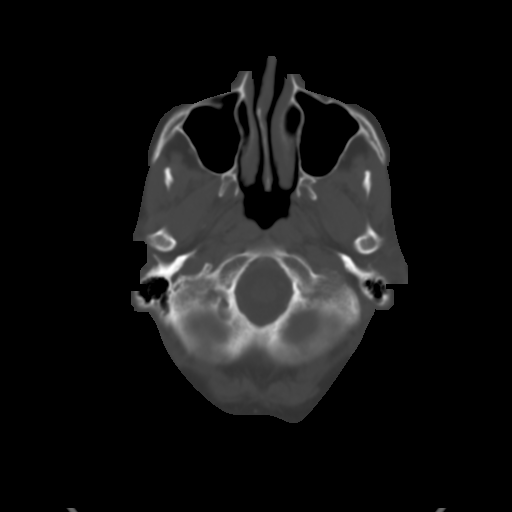
[im 9/35  brain]
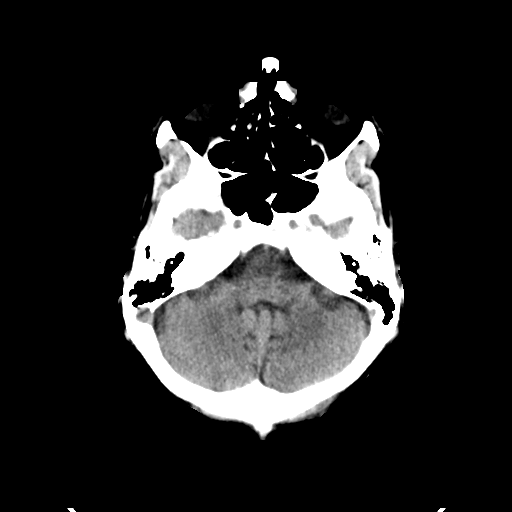
[im 13/35  brain]
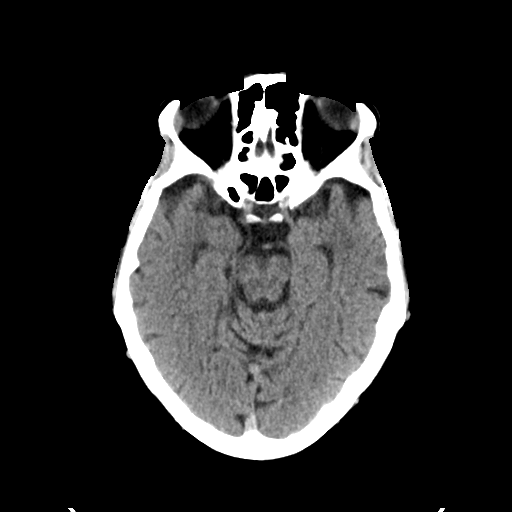
[im 18/35  brain]
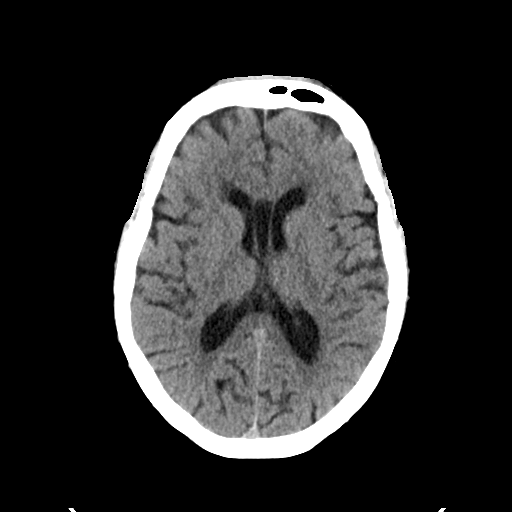
[im 22/35  brain]
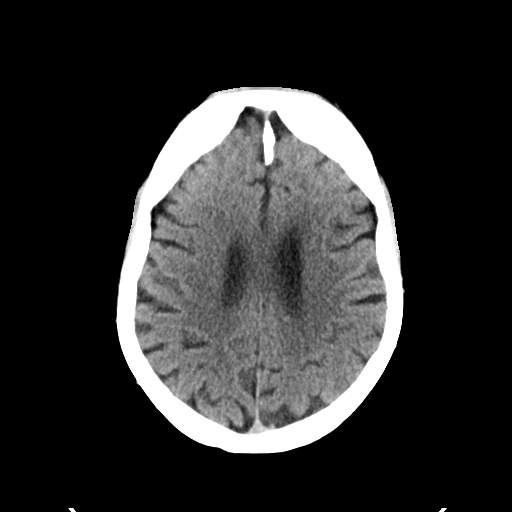
[im 22/35  bone]
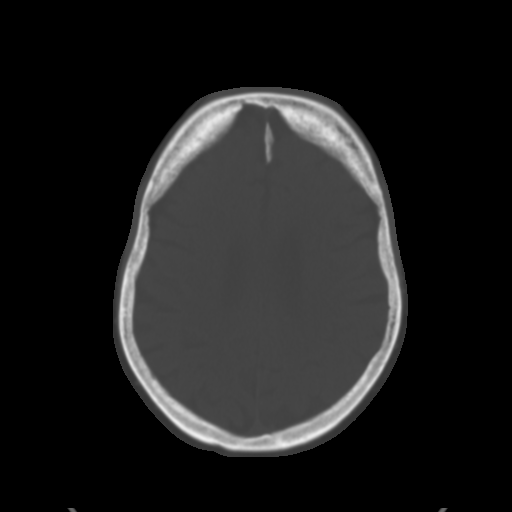
[im 26/35  brain]
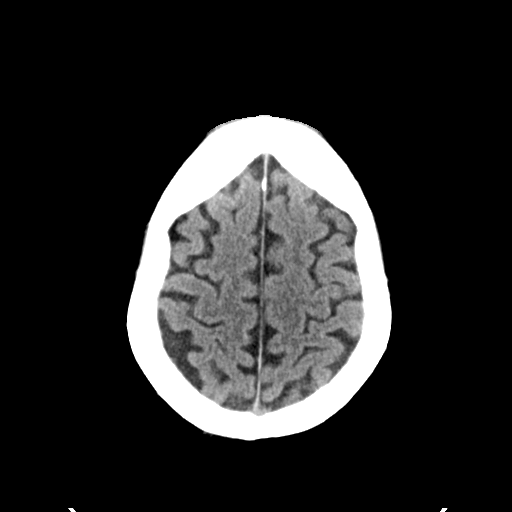
[im 30/35  brain]
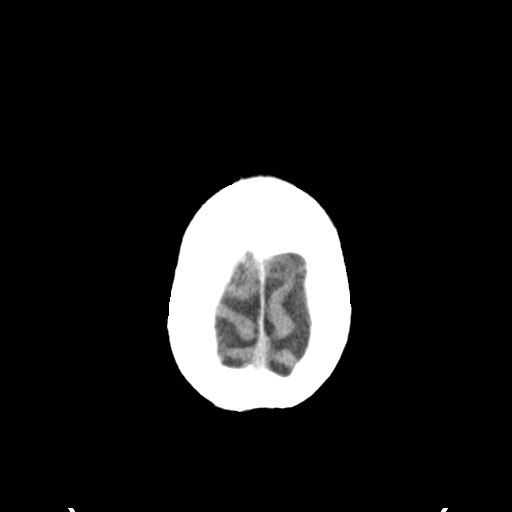

[Series 4: head bone · axial · 0.50mm/px · z∈[-185,-151]mm · 3 of 86 slices shown]
[im 9/86  bone]
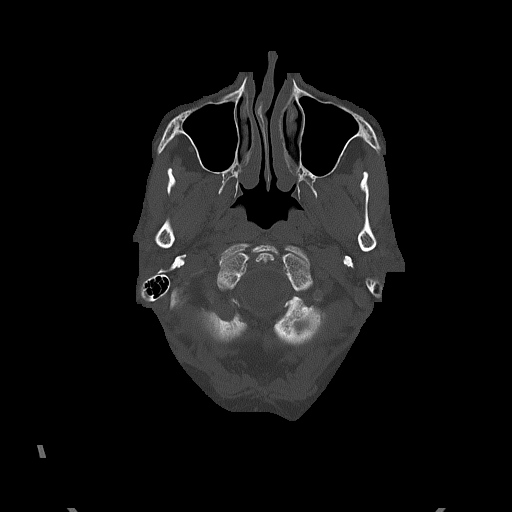
[im 18/86  bone]
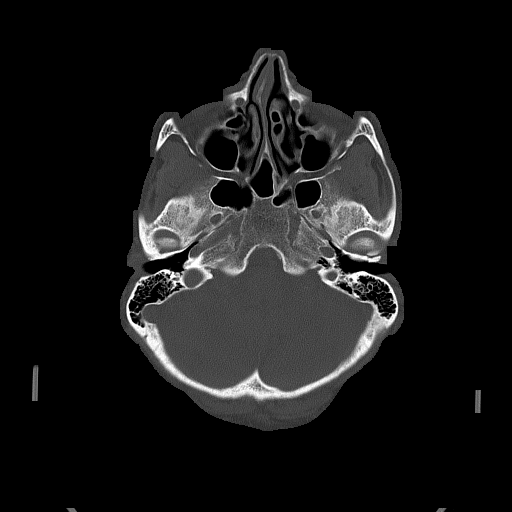
[im 26/86  bone]
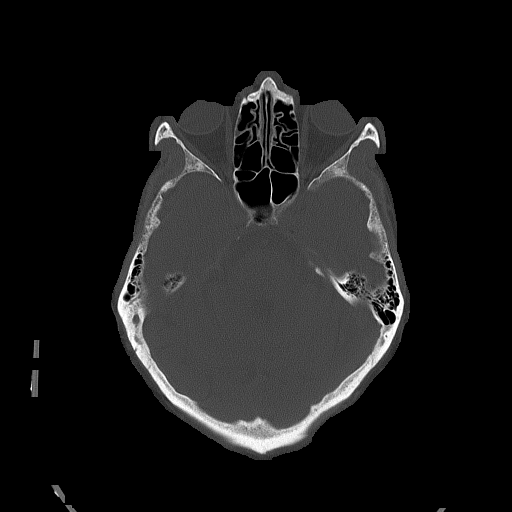

[Series 5: head without cor · coronal · non-contrast · 0.40mm/px · 3 of 80 slices shown]
[im 27/80  brain]
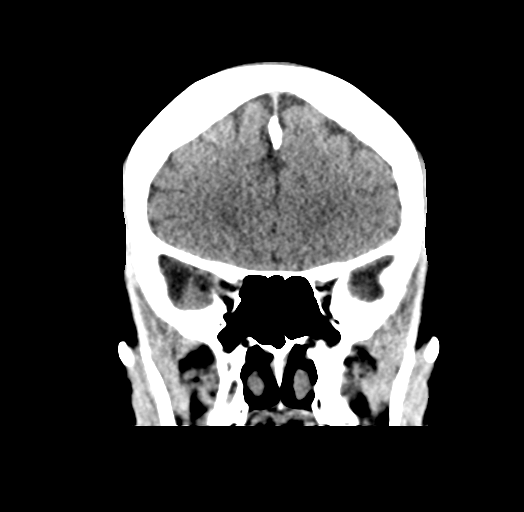
[im 36/80  brain]
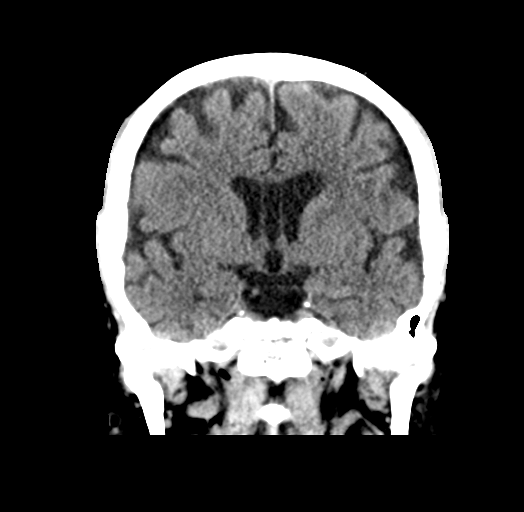
[im 44/80  brain]
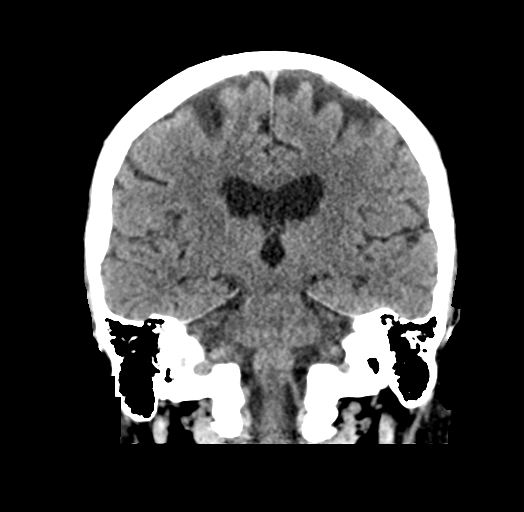

[Series 6: head without sag · sagittal · non-contrast · 0.35mm/px · 3 of 67 slices shown]
[im 23/67  brain]
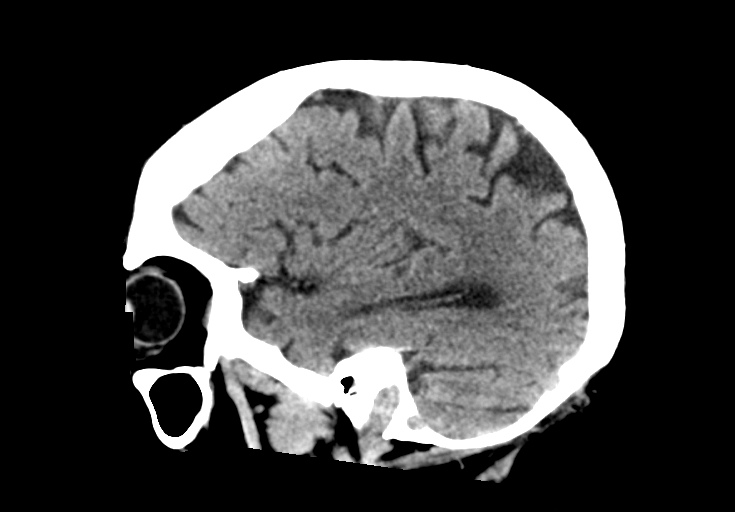
[im 34/67  brain]
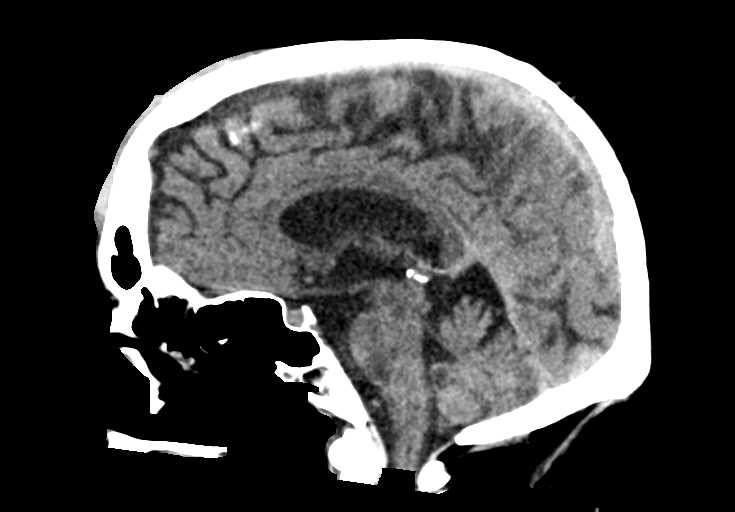
[im 45/67  brain]
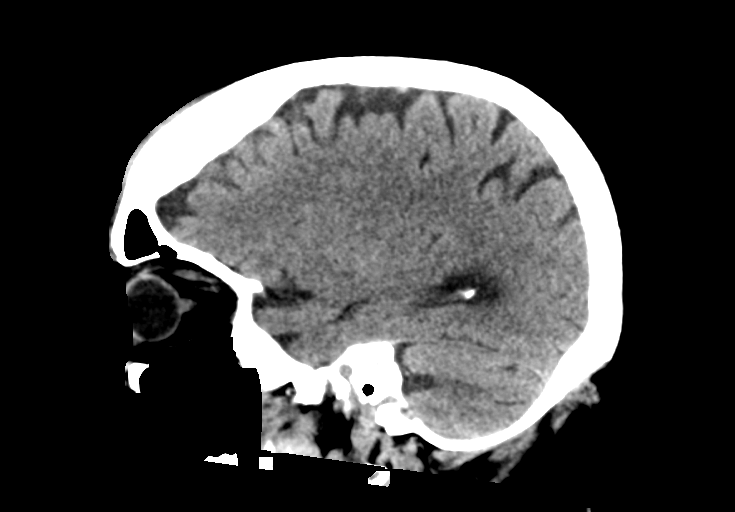

[16 of 47 positions shown; findings below may reference images not displayed]

FINDINGS: Brain: There is no mass, hemorrhage or extra-axial collection. The
size and configuration of the ventricles and extra-axial CSF spaces
are normal. The brain parenchyma is normal, without acute or chronic
infarction.

Vascular: No abnormal hyperdensity of the major intracranial
arteries or dural venous sinuses. No intracranial atherosclerosis.

Skull: Frontal scalp hematoma.  No skull fracture.

Sinuses/Orbits: No fluid levels or advanced mucosal thickening of
the visualized paranasal sinuses. No mastoid or middle ear effusion.
The orbits are normal.
IMPRESSION: 1. No acute intracranial abnormality.
2. Frontal scalp hematoma without skull fracture.

## 2021-07-21 NOTE — Progress Notes (Signed)
 Subjective:  Patient ID: Miranda Dominguez is a 61 y.o. female.  Chief Complaint  Patient presents with  . Respiratory         HPI    Respiratory    Additional comments:        Last edited by Tobias Domino, NP on 07/21/2021  2:46 PM.     HPI  Respiratory Patient presents with: cold and cough   Patient presents with comment:  Home covid test negative  Chronicity:  New Onset:  3 days Cough characteristics:  Productive of sputum Treatments tried:  Nothing Associated symptoms: headaches, cough and rhinorrhea   Associated symptoms: no decreased appetite, no chest pain, no chest tightness, no ear pain, no fever, no malaise, no fatigue, no myalgias, no congestion, no postnasal drip, no nocturnal dyspnea, no shortness of breath, no sore throat, no sweats, no unplanned weight loss, no sinus pain, no rash, no urticaria, no abdominal pain, no itching, no swelling, no drooling, no trouble swallowing, no vomiting, no syncope, no sneezing, no swollen glands and no wheezing   Recent travel:  None History of asthma: No   History of COPD: No          Review of Systems  Constitutional: Negative for decreased appetite, fatigue and fever.  HENT: Positive for rhinorrhea. Negative for congestion, drooling, ear pain, postnasal drip, sinus pain, sneezing, sore throat and trouble swallowing.   Respiratory: Positive for cough. Negative for chest tightness, shortness of breath and wheezing.   Cardiovascular: Negative for chest pain and syncope.  Gastrointestinal: Negative for abdominal pain and vomiting.  Musculoskeletal: Negative for myalgias.  Skin: Negative for itching and rash.  Neurological: Positive for headaches.    Social History   Tobacco Use  Smoking Status Never  Smokeless Tobacco Never   Past Medical History:  Diagnosis Date  . Anxiety   . Hypertension   . Sarcoidosis    History reviewed. No pertinent surgical history. History reviewed. No pertinent family  history. Objective:      Physical Exam Vitals reviewed.  Constitutional:      General: She is not in acute distress.    Appearance: She is not ill-appearing.  HENT:     Head: Normocephalic and atraumatic.     Right Ear: Hearing, tympanic membrane, ear canal and external ear normal.     Left Ear: Hearing, tympanic membrane, ear canal and external ear normal.     Nose: Rhinorrhea present. No congestion.     Right Turbinates: Not swollen.     Left Turbinates: Not swollen.     Right Sinus: No maxillary sinus tenderness or frontal sinus tenderness.     Left Sinus: No maxillary sinus tenderness or frontal sinus tenderness.     Mouth/Throat:     Lips: Pink.     Mouth: Mucous membranes are moist.     Pharynx: Oropharynx is clear. Uvula midline. No pharyngeal swelling, oropharyngeal exudate, posterior oropharyngeal erythema or uvula swelling.     Tonsils: No tonsillar exudate or tonsillar abscesses. 1+ on the right. 1+ on the left.  Eyes:     Conjunctiva/sclera: Conjunctivae normal.  Cardiovascular:     Rate and Rhythm: Normal rate and regular rhythm.     Heart sounds: Normal heart sounds, S1 normal and S2 normal. No murmur heard.   No friction rub. No gallop.  Pulmonary:     Effort: Pulmonary effort is normal. No respiratory distress.     Breath sounds: Normal breath sounds. No stridor, decreased air movement  or transmitted upper airway sounds. No decreased breath sounds, wheezing, rhonchi or rales.  Abdominal:     Palpations: There is no mass.     Tenderness: There is no abdominal tenderness.  Lymphadenopathy:     Cervical: No cervical adenopathy.  Skin:    General: Skin is warm and dry.  Neurological:     Mental Status: She is alert and oriented to person, place, and time.  Psychiatric:        Mood and Affect: Mood normal.        Behavior: Behavior is cooperative.       Assessment/Plan:  HPI provided by Self  Based on today's visit:history and physical exam only, as no  relevant testing deemed necessary patient's visit diagnosis is/includes  1. Acute nasopharyngitis (common cold)   2. Acute cough   3. Essential hypertension    Patient has a history of chronic conditions and those listed in the visit diagnoses were reviewed today. They are currently unstable not on medications.  Treatment plan includes:  Orders Placed: No orders of the defined types were placed in this encounter.  Medications ordered this visit   Signed Prescriptions Disp Refills  . acetaminophen  (TYLENOL ) 500 MG tablet 30 tablet 0    Sig: Take 1-2 tablets (500-1,000 mg total) by mouth every 6 (six) hours as needed for pain (Max 6 tablets/24 hrs) for up to 7 days  . benzonatate (TESSALON) 100 MG capsule 30 capsule 0    Sig: Swallow whole one (100mg ) capsule by mouth 3 times a day  as needed.Do not break, chew, dissolve, cut or crush.  . sodium chloride  (OCEAN) 0.65 % nasal spray 15 mL 12    Sig: Instill 1 spray into each nostril as needed for congestion    Current medication list and any new medications prescribed or recommended today were reviewed with the patient and specific instructions were provided Yes  Provider Recommendations   Claritin as per label instructions.  Avoid decongestants.    Patient has been out of BP medication for a while.  Patients PCP left and she has not obtained another PCP as of yet.   Patient declined blood work and BP medication initiation today.   Patient presents with chronic diagnoses: Hypertension not well controlled at time of visit. There are not associated manifestations due to heart failure/chronic kidney disease/etc. Patient's medication regimen has been appropriately documented on the medication list. Routine labs, testing, and/or radiology related to chronic condition have not been performed within the last year. Reinforced management strategies and reviewed considerations for current treatment, within the context of the underlying  condition as well as chronic conditions. Follow-up with PCP or [facility] in Followup times: 2 weeks for a recheck of your chronic illness.    Follow up care instructions were provided and reviewed?with the  Patient. All questions were answered. Patient verbalized understanding of plan of care today.    I have reviewed with the patient the importance of following up with a Primary Care Provider (PCP) for overall management of preventative and chronic care needs. : Yes                                        I am having Olam Flies start on acetaminophen , benzonatate, and sodium chloride .

## 2021-12-29 ENCOUNTER — Other Ambulatory Visit (HOSPITAL_BASED_OUTPATIENT_CLINIC_OR_DEPARTMENT_OTHER): Payer: Self-pay | Admitting: Family Medicine

## 2021-12-29 DIAGNOSIS — R748 Abnormal levels of other serum enzymes: Secondary | ICD-10-CM

## 2022-01-06 ENCOUNTER — Ambulatory Visit (HOSPITAL_BASED_OUTPATIENT_CLINIC_OR_DEPARTMENT_OTHER)
Admission: RE | Admit: 2022-01-06 | Discharge: 2022-01-06 | Disposition: A | Discharge status: Home / Self Care | Payer: 59 | Source: Ambulatory Visit | Attending: Family Medicine | Admitting: Family Medicine

## 2022-01-06 DIAGNOSIS — R748 Abnormal levels of other serum enzymes: Secondary | ICD-10-CM | POA: Insufficient documentation

## 2023-01-21 ENCOUNTER — Institutional Professional Consult (permissible substitution): Payer: 59 | Admitting: Pulmonary Disease

## 2023-03-06 ENCOUNTER — Institutional Professional Consult (permissible substitution): Payer: 59 | Admitting: Emergency Medicine

## 2023-04-12 ENCOUNTER — Institutional Professional Consult (permissible substitution): Payer: 59 | Admitting: Emergency Medicine

## 2023-05-23 ENCOUNTER — Ambulatory Visit: Payer: 59 | Admitting: Emergency Medicine

## 2023-09-24 ENCOUNTER — Emergency Department (HOSPITAL_COMMUNITY)

## 2023-09-24 ENCOUNTER — Other Ambulatory Visit: Payer: Self-pay

## 2023-09-24 ENCOUNTER — Emergency Department (HOSPITAL_COMMUNITY)
Admission: EM | Admit: 2023-09-24 | Discharge: 2023-09-24 | Disposition: A | Discharge status: Home / Self Care | Attending: Emergency Medicine | Admitting: Emergency Medicine

## 2023-09-24 ENCOUNTER — Encounter (HOSPITAL_COMMUNITY): Payer: Self-pay

## 2023-09-24 DIAGNOSIS — D539 Nutritional anemia, unspecified: Secondary | ICD-10-CM | POA: Diagnosis not present

## 2023-09-24 DIAGNOSIS — I10 Essential (primary) hypertension: Secondary | ICD-10-CM | POA: Diagnosis not present

## 2023-09-24 DIAGNOSIS — R202 Paresthesia of skin: Secondary | ICD-10-CM | POA: Diagnosis present

## 2023-09-24 DIAGNOSIS — D7589 Other specified diseases of blood and blood-forming organs: Secondary | ICD-10-CM | POA: Diagnosis not present

## 2023-09-24 LAB — CBC
HCT: 44.4 % (ref 36.0–46.0)
Hemoglobin: 15.1 g/dL — ABNORMAL HIGH (ref 12.0–15.0)
MCH: 35.2 pg — ABNORMAL HIGH (ref 26.0–34.0)
MCHC: 34 g/dL (ref 30.0–36.0)
MCV: 103.5 fL — ABNORMAL HIGH (ref 80.0–100.0)
Platelets: 250 K/uL (ref 150–400)
RBC: 4.29 MIL/uL (ref 3.87–5.11)
RDW: 12.3 % (ref 11.5–15.5)
WBC: 8 K/uL (ref 4.0–10.5)
nRBC: 0 % (ref 0.0–0.2)

## 2023-09-24 LAB — COMPREHENSIVE METABOLIC PANEL WITH GFR
ALT: 43 U/L (ref 0–44)
AST: 72 U/L — ABNORMAL HIGH (ref 15–41)
Albumin: 3.3 g/dL — ABNORMAL LOW (ref 3.5–5.0)
Alkaline Phosphatase: 88 U/L (ref 38–126)
Anion gap: 18 — ABNORMAL HIGH (ref 5–15)
BUN: <5 mg/dL — ABNORMAL LOW (ref 8–23)
CO2: 14 mmol/L — ABNORMAL LOW (ref 22–32)
Calcium: 8.9 mg/dL (ref 8.9–10.3)
Chloride: 102 mmol/L (ref 98–111)
Creatinine, Ser: 0.59 mg/dL (ref 0.44–1.00)
GFR, Estimated: >60 mL/min (ref >60)
Glucose, Bld: 81 mg/dL (ref 70–99)
Potassium: 3.7 mmol/L (ref 3.5–5.1)
Sodium: 134 mmol/L — ABNORMAL LOW (ref 135–145)
Total Bilirubin: 1.7 mg/dL — ABNORMAL HIGH (ref 0.0–1.2)
Total Protein: 6.7 g/dL (ref 6.5–8.1)

## 2023-09-24 LAB — CBG MONITORING, ED: Glucose-Capillary: 89 mg/dL (ref 70–99)

## 2023-09-24 LAB — ETHANOL: Alcohol, Ethyl (B): 123 mg/dL — ABNORMAL HIGH (ref <15)

## 2023-09-24 LAB — LIPASE, BLOOD: Lipase: 36 U/L (ref 11–51)

## 2023-09-24 MED ORDER — GADOBUTROL 1 MMOL/ML IV SOLN
7.5000 mL | Freq: Once | INTRAVENOUS | Status: AC | PRN
  Administered 2023-09-24 (×2): 7.5 mL via INTRAVENOUS

## 2023-09-24 MED ORDER — SODIUM CHLORIDE 0.9 % IV BOLUS
500.0000 mL | Freq: Once | INTRAVENOUS | Status: AC
  Administered 2023-09-24 (×2): 500 mL via INTRAVENOUS

## 2023-09-24 MED ORDER — THIAMINE HCL 100 MG/ML IJ SOLN
100.0000 mg | Freq: Once | INTRAMUSCULAR | Status: AC
  Administered 2023-09-24 (×2): 100 mg via INTRAVENOUS
  Filled 2023-09-24: qty 2

## 2023-09-24 MED ORDER — DIAZEPAM 5 MG/ML IJ SOLN
5.0000 mg | Freq: Once | INTRAMUSCULAR | Status: AC | PRN
  Administered 2023-09-24 (×2): 5 mg via INTRAVENOUS
  Filled 2023-09-24: qty 2

## 2023-09-24 NOTE — ED Triage Notes (Signed)
 Pt endorses etoh use, 8-12 beers/day; last drink today; no other neuro deficits noted

## 2023-09-24 NOTE — ED Triage Notes (Signed)
 Pt bib POV c/o numbness that starts at her eyebrows and travel to the back of head. When pt lifts eyebrows she feels a pulling sensation that sharp.  LKW Wednesday   Pt denies dizziness,  migraine, head injury, or numbness anywhere else.

## 2023-09-24 NOTE — ED Provider Triage Note (Signed)
 Emergency Medicine Provider Triage Evaluation Note  Miranda Dominguez , a 63 y.o. female  was evaluated in triage.  Pt complains of subjective tingling sensation that starts at left eyebrow and travels to the center of the scalp on the left.  She feels a sharp painful sensation when she lifts eyebrows on that side.  She endorses 8-12 beers per day, has had 3 beers today.  She reports the symptoms started sometime this weekend and have been somewhat stable since then.  No numbness or weakness of arms, legs, no vision changes.  No similar history of this.  Review of Systems  Positive: Tingling, headache Negative:   Physical Exam  BP 139/81   Pulse 100   Temp 98 F (36.7 C)   Resp 18   SpO2 99%  Gen:   Awake, no distress   Resp:  Normal effort  MSK:   Moves extremities without difficulty  Other:  Moves all 4 limbs spontaneously, CN II through XII grossly intact, can ambulate without difficulty, intact sensation throughout --subjective sensory deficit on the left frontal scalp with some retained sensation, no complete numbness.  Medical Decision Making  Medically screening exam initiated at 1:13 PM.  Appropriate orders placed.  AVIS TIRONE was informed that the remainder of the evaluation will be completed by another provider, this initial triage assessment does not replace that evaluation, and the importance of remaining in the ED until their evaluation is complete.  Workup initiated in triage    Rosan Sherlean DEL, [place] 09/24/23 1313

## 2023-09-24 NOTE — ED Notes (Signed)
 Pt ambulated to restroom and from restroom with steady gait.

## 2023-09-24 NOTE — Discharge Instructions (Addendum)
 As we discussed your workup today was reassuring, there is nothing like a stroke causing the numbness of your scalp.  I suspect that it could be related to your alcohol  use, possible vitamin deficiency, I recommend that you follow-up with neurology, return if you have severe worsening numbness, tingling, new vision changes.  I recommend that you stop drinking alcohol .

## 2023-09-24 NOTE — ED Provider Notes (Signed)
 [place] EMERGENCY DEPARTMENT AT [facility] Provider Note   CSN: [id number] Arrival date & time: 09/24/23  1241     Patient presents with: Numbness   Miranda Dominguez is a 63 y.o. female Pt complains of subjective tingling sensation that starts at left eyebrow and travels to the center of the scalp on the left.  She feels a sharp painful sensation when she lifts eyebrows on that side.  She endorses 8-12 beers per day, has had 3 beers today.  She reports the symptoms started sometime this weekend and have been somewhat stable since then.  No numbness or weakness of arms, legs, no vision changes.  No similar history of this.    HPI     Prior to Admission medications   Medication Sig Start Date End Date Taking? Authorizing Provider  atenolol (TENORMIN) 50 MG tablet Take 50 mg by mouth every morning.     [provider]  diazepam  (VALIUM ) 5 MG tablet Take 1 tablet (5 mg total) by mouth every 6 (six) hours as needed (urinary retention). 06/18/16   Thomas, Alicia, MD  hydrochlorothiazide (HYDRODIURIL) 25 MG tablet Take 25 mg by mouth every morning.     [provider]  neomycin -bacitracin -polymyxin (NEOSPORIN) ointment Apply 1 application topically every 12 (twelve) hours. 08/08/19   Charlyn Sora, MD  oxyCODONE  (OXY IR/ROXICODONE ) 5 MG immediate release tablet Take 1-2 tablets (5-10 mg total) by mouth every 4 (four) hours as needed. 06/18/16   Debby Hila, MD  pregabalin  (LYRICA ) 150 MG capsule Take 1 capsule (150 mg total) by mouth 3 (three) times daily. 08/31/19 08/30/20  Kober, Charles E, PA-C    Allergies: Patient has no known allergies.    Review of Systems  All other systems reviewed and are negative.   Updated Vital Signs BP (!) 161/83 (BP Location: Left Arm)   Pulse (!) 107   Temp 97.6 F (36.4 C) (Oral)   Resp 18   SpO2 100%   Physical Exam Vitals and nursing note reviewed.  Constitutional:      General: She is not in acute distress.     Appearance: Normal appearance.  HENT:     Head: Normocephalic and atraumatic.  Eyes:     General:        Right eye: No discharge.        Left eye: No discharge.  Cardiovascular:     Rate and Rhythm: Normal rate and regular rhythm.     Heart sounds: No murmur heard.    No friction rub. No gallop.  Pulmonary:     Effort: Pulmonary effort is normal.     Breath sounds: Normal breath sounds.  Abdominal:     General: Bowel sounds are normal.     Palpations: Abdomen is soft.  Skin:    General: Skin is warm and dry.     Capillary Refill: Capillary refill takes less than 2 seconds.  Neurological:     Mental Status: She is alert and oriented to person, place, and time.     Comments:  Moves all 4 limbs spontaneously, CN II through XII grossly intact, can ambulate without difficulty, intact sensation throughout --subjective sensory deficit on the left frontal scalp with some retained sensation, no complete numbness.  Psychiatric:        Mood and Affect: Mood normal.        Behavior: Behavior normal.     (all labs ordered are listed, but only abnormal results are displayed) Labs Reviewed  CBC - Abnormal; Notable for the following components:      Result Value   Hemoglobin 15.1 (*)    MCV 103.5 (*)    MCH 35.2 (*)    All other components within normal limits  COMPREHENSIVE METABOLIC PANEL WITH GFR - Abnormal; Notable for the following components:   Sodium 134 (*)    CO2 14 (*)    BUN <5 (*)    Albumin 3.3 (*)    AST 72 (*)    Total Bilirubin 1.7 (*)    Anion gap 18 (*)    All other components within normal limits  ETHANOL - Abnormal; Notable for the following components:   Alcohol , Ethyl (B) 123 (*)    All other components within normal limits  LIPASE, BLOOD  CBG MONITORING, ED    EKG: None  Radiology: MR Brain W and Wo Contrast Result Date: 09/24/2023 CLINICAL DATA:  Initial evaluation for acute headache, neuro deficit. EXAM: MRI HEAD WITHOUT AND WITH CONTRAST TECHNIQUE:  Multiplanar, multiecho pulse sequences of the brain and surrounding structures were obtained without and with intravenous contrast. CONTRAST:  7.5mL GADAVIST  GADOBUTROL  1 MMOL/ML IV SOLN COMPARISON:  CT from earlier the same day. FINDINGS: Brain: Mild age-related cerebral atrophy. Patchy T2/FLAIR hyperintensity involving the periventricular deep white matter both cerebral hemispheres as well as the pons, consistent with chronic small vessel ischemic disease, mild for age. No evidence for acute or subacute infarct. No areas of chronic cortical infarction. No acute or chronic intracranial blood products. No mass lesion, midline shift or mass effect. No hydrocephalus or extra-axial fluid collection. Pituitary gland within normal limits. No abnormal enhancement. Vascular: Major intracranial vascular flow voids are maintained. Skull and upper cervical spine: Cranial junction within normal limits. Bone marrow signal intensity within normal limits. No scalp soft tissue abnormality. Sinuses/Orbits: Globes orbital soft tissues within normal limits. Paranasal sinuses are largely clear. No significant mastoid effusion. Other: None. IMPRESSION: 1. No acute intracranial abnormality. 2. Mild age-related cerebral atrophy with chronic small vessel ischemic disease. Electronically Signed   By: Morene Hoard M.D.   On: 09/24/2023 20:28   CT Head Wo Contrast Result Date: 09/24/2023 EXAM: CT HEAD WITHOUT CONTRAST 09/24/2023 01:26:49 PM TECHNIQUE: CT of the head was performed without the administration of intravenous contrast. Automated exposure control, iterative reconstruction, and/or weight based adjustment of the mA/kV was utilized to reduce the radiation dose to as low as reasonably achievable. COMPARISON: CT of the head dated 08/07/2019. CLINICAL HISTORY: Headache, neuro deficit. FINDINGS: BRAIN AND VENTRICLES: No acute hemorrhage. Gray-white differentiation is preserved. No hydrocephalus. No extra-axial collection. No  mass effect or midline shift. Age-related cerebral volume loss. Moderate periventricular white matter disease. A cavum septum pellucidum et vergae is incidentally noted. ORBITS: No acute abnormality. SINUSES: Mild mucosal disease within the floors of the maxillary sinuses. SOFT TISSUES AND SKULL: No acute soft tissue abnormality. No skull fracture. Calcifications within the carotid siphons. IMPRESSION: 1. No acute intracranial abnormality. 2. Age-related cerebral volume loss and moderate periventricular white matter disease. Electronically signed by: evalene coho 09/24/2023 02:06 PM EDT RP Workstation: [id number]     Procedures   Medications Ordered in the ED  diazepam  (VALIUM ) injection 5 mg (5 mg Intravenous Given 09/24/23 1745)  thiamine  (VITAMIN B1) injection 100 mg (100 mg Intravenous Given 09/24/23 1800)  sodium chloride  0.9 % bolus 500 mL (500 mLs Intravenous New Bag/Given 09/24/23 1800)  gadobutrol  (GADAVIST ) 1 MMOL/ML injection 7.5 mL (7.5 mLs Intravenous Contrast Given 09/24/23 1815)  Medical Decision Making Amount and/or Complexity of Data Reviewed Labs: ordered. Radiology: ordered.  Risk Prescription drug management.   This patient is a 63 y.o. female  who presents to the ED for concern of numbness, tingling.   Differential diagnoses prior to evaluation: The emergent differential diagnosis includes, but is not limited to B12 deficiency, atypical seizure presentation, CVA or other acute intracranial abnormality vs other Stroke, increased ICP, meningitis, CVA, intracranial tumor, venous sinus thrombosis, migraine, cluster headache, hypertension, drug related, head injury, tension headache, sinusitis, dental abscess, otitis media, TMJ, temporal arteritis, glaucoma, trigeminal neuralgia. . This is not an exhaustive differential.   Past Medical History / Co-morbidities / Social History: Sarcoidosis, alcohol  dependence, hypertension  Physical  Exam: Physical exam performed. The pertinent findings include: Mild tachycardia in the ED, pulse 101, then 107.  Somewhat hypertensive, blood pressure 161/83, vital signs otherwise stable.  No focal neurologic deficits other than some subjective tingling of the left frontal scalp.  No complete sensory loss.  No other left-sided symptoms.  Lab Tests/Imaging studies: I personally interpreted labs/imaging and the pertinent results include: CBC overall unremarkable, mild macrocytic quality of blood cells could suggest some vitamin B deficiency.  CMP with bicarb deficit, CO2 14, anion gap of 18, mildly elevated bilirubin at 1.7, mildly elevated AST at 72, suspect this is related to her alcohol  intoxication, ethanol 123 on arrival.  CT head, MR brain with and without contrast showed some age-related white matter changes, no focal infarct to explain patient's symptoms.. I agree with the radiologist interpretation.  Cardiac monitoring: EKG obtained and interpreted by myself and attending physician which shows: Sinus tachycardia with PVCs   Medications: I ordered medication including fluid bolus, thiamine , Valium .  I have reviewed the patients home medicines and have made adjustments as needed.   Disposition: After consideration of the diagnostic results and the patients response to treatment, I feel that patient is stable for discharge, ambulatory referral to neurology placed for further evaluation of paresthesias.   emergency department workup does not suggest an emergent condition requiring admission or immediate intervention beyond what has been performed at this time. The plan is: as above. The patient is safe for discharge and has been instructed to return immediately for worsening symptoms, change in symptoms or any other concerns.   Final diagnoses:  Paresthesia    ED Discharge Orders          Ordered    Ambulatory referral to Neurology       Comments: An appointment is requested in  approximately: 4 weeks   09/24/23 2039               Rosan Sherlean VEAR DEVONNA 09/24/23 2043    Patsey Lot, MD 09/24/23 2322

## 2024-03-09 ENCOUNTER — Ambulatory Visit: Admitting: Neurology
